# Patient Record
Sex: Male | Born: 1950 | Race: White | Hispanic: No | Smoking: Current every day smoker
Health system: Southern US, Community
[De-identification: ages and names within clinical notes are randomized; demographics above are authoritative.]

## PROBLEM LIST (undated history)

## (undated) DIAGNOSIS — I714 Abdominal aortic aneurysm, without rupture, unspecified: Secondary | ICD-10-CM

## (undated) DIAGNOSIS — I712 Thoracic aortic aneurysm, without rupture: Secondary | ICD-10-CM

## (undated) DIAGNOSIS — G4733 Obstructive sleep apnea (adult) (pediatric): Secondary | ICD-10-CM

## (undated) DIAGNOSIS — H9190 Unspecified hearing loss, unspecified ear: Secondary | ICD-10-CM

## (undated) DIAGNOSIS — I219 Acute myocardial infarction, unspecified: Secondary | ICD-10-CM

## (undated) DIAGNOSIS — I10 Essential (primary) hypertension: Secondary | ICD-10-CM

## (undated) DIAGNOSIS — I7121 Aneurysm of the ascending aorta, without rupture: Secondary | ICD-10-CM

## (undated) DIAGNOSIS — J449 Chronic obstructive pulmonary disease, unspecified: Secondary | ICD-10-CM

## (undated) DIAGNOSIS — M1990 Unspecified osteoarthritis, unspecified site: Secondary | ICD-10-CM

## (undated) DIAGNOSIS — M545 Low back pain, unspecified: Secondary | ICD-10-CM

## (undated) DIAGNOSIS — Z9989 Dependence on other enabling machines and devices: Secondary | ICD-10-CM

## (undated) DIAGNOSIS — I2510 Atherosclerotic heart disease of native coronary artery without angina pectoris: Secondary | ICD-10-CM

## (undated) DIAGNOSIS — G8929 Other chronic pain: Secondary | ICD-10-CM

## (undated) DIAGNOSIS — G709 Myoneural disorder, unspecified: Secondary | ICD-10-CM

## (undated) DIAGNOSIS — E785 Hyperlipidemia, unspecified: Secondary | ICD-10-CM

## (undated) HISTORY — PX: ANTERIOR CERVICAL DECOMP/DISCECTOMY FUSION: SHX1161

## (undated) HISTORY — DX: Essential (primary) hypertension: I10

## (undated) HISTORY — DX: Hyperlipidemia, unspecified: E78.5

## (undated) HISTORY — PX: SPINAL CORD STIMULATOR IMPLANT: SHX2422

## (undated) HISTORY — PX: NASAL SEPTUM SURGERY: SHX37

## (undated) HISTORY — PX: CERVICAL DISC SURGERY: SHX588

## (undated) HISTORY — PX: BACK SURGERY: SHX140

## (undated) HISTORY — PX: POSTERIOR LUMBAR FUSION: SHX6036

## (undated) HISTORY — PX: LAPAROSCOPIC CHOLECYSTECTOMY: SUR755

## (undated) HISTORY — PX: TONSILLECTOMY: SUR1361

## (undated) HISTORY — PX: NASAL SINUS SURGERY: SHX719

## (undated) HISTORY — PX: INGUINAL HERNIA REPAIR: SUR1180

---

## 2001-10-14 ENCOUNTER — Encounter: Payer: Self-pay | Admitting: Emergency Medicine

## 2001-10-14 ENCOUNTER — Emergency Department (HOSPITAL_COMMUNITY): Admission: EM | Admit: 2001-10-14 | Discharge: 2001-10-14 | Payer: Self-pay | Admitting: Emergency Medicine

## 2004-07-15 DIAGNOSIS — I219 Acute myocardial infarction, unspecified: Secondary | ICD-10-CM

## 2004-07-15 HISTORY — PX: LUMBAR DISC SURGERY: SHX700

## 2004-07-15 HISTORY — PX: CARDIAC CATHETERIZATION: SHX172

## 2004-07-15 HISTORY — DX: Acute myocardial infarction, unspecified: I21.9

## 2004-08-17 ENCOUNTER — Encounter: Admission: RE | Admit: 2004-08-17 | Discharge: 2004-08-17 | Payer: Self-pay | Admitting: Specialist

## 2004-11-30 ENCOUNTER — Ambulatory Visit (HOSPITAL_COMMUNITY): Admission: RE | Admit: 2004-11-30 | Discharge: 2004-11-30 | Payer: Self-pay | Admitting: Specialist

## 2004-11-30 ENCOUNTER — Emergency Department (HOSPITAL_COMMUNITY): Admission: EM | Admit: 2004-11-30 | Discharge: 2004-11-30 | Payer: Self-pay | Admitting: Emergency Medicine

## 2004-12-03 ENCOUNTER — Ambulatory Visit (HOSPITAL_COMMUNITY): Admission: RE | Admit: 2004-12-03 | Discharge: 2004-12-04 | Payer: Self-pay | Admitting: Cardiology

## 2005-03-07 ENCOUNTER — Ambulatory Visit (HOSPITAL_COMMUNITY): Admission: RE | Admit: 2005-03-07 | Discharge: 2005-03-08 | Payer: Self-pay | Admitting: Specialist

## 2005-03-07 ENCOUNTER — Encounter (INDEPENDENT_AMBULATORY_CARE_PROVIDER_SITE_OTHER): Payer: Self-pay | Admitting: Specialist

## 2007-07-16 HISTORY — PX: CORONARY ANGIOPLASTY WITH STENT PLACEMENT: SHX49

## 2008-07-15 HISTORY — PX: ANTERIOR LUMBAR FUSION: SHX1170

## 2008-07-15 HISTORY — PX: INCISION AND DRAINAGE POSTERIOR LUMBAR SPINE: SHX1805

## 2009-01-27 ENCOUNTER — Encounter: Admission: RE | Admit: 2009-01-27 | Discharge: 2009-01-27 | Payer: Self-pay | Admitting: Orthopedic Surgery

## 2009-03-16 ENCOUNTER — Inpatient Hospital Stay (HOSPITAL_COMMUNITY): Admission: RE | Admit: 2009-03-16 | Discharge: 2009-03-18 | Payer: Self-pay | Admitting: Orthopedic Surgery

## 2009-03-17 ENCOUNTER — Encounter (INDEPENDENT_AMBULATORY_CARE_PROVIDER_SITE_OTHER): Payer: Self-pay | Admitting: Orthopedic Surgery

## 2009-03-17 ENCOUNTER — Ambulatory Visit: Payer: Self-pay | Admitting: Vascular Surgery

## 2009-03-27 ENCOUNTER — Encounter: Admission: RE | Admit: 2009-03-27 | Discharge: 2009-03-27 | Payer: Self-pay | Admitting: Orthopedic Surgery

## 2009-04-21 ENCOUNTER — Ambulatory Visit: Payer: Self-pay | Admitting: Vascular Surgery

## 2009-08-30 ENCOUNTER — Inpatient Hospital Stay (HOSPITAL_COMMUNITY): Admission: RE | Admit: 2009-08-30 | Discharge: 2009-09-01 | Payer: Self-pay | Admitting: Orthopedic Surgery

## 2009-09-26 ENCOUNTER — Ambulatory Visit (HOSPITAL_COMMUNITY): Admission: RE | Admit: 2009-09-26 | Discharge: 2009-09-26 | Payer: Self-pay | Admitting: Orthopedic Surgery

## 2009-10-09 ENCOUNTER — Inpatient Hospital Stay (HOSPITAL_COMMUNITY): Admission: AD | Admit: 2009-10-09 | Discharge: 2009-10-10 | Payer: Self-pay | Admitting: Orthopedic Surgery

## 2010-05-31 ENCOUNTER — Observation Stay (HOSPITAL_COMMUNITY): Admission: RE | Admit: 2010-05-31 | Discharge: 2010-06-01 | Payer: Self-pay | Admitting: Orthopedic Surgery

## 2010-09-13 IMAGING — RF DG LUMBAR SPINE 2-3V
1 series · 2 of 2 positions shown · non-contrast
Comparison: 03/13/2009 study.

CLINICAL DATA: History given of degenerative disc disease.

LUMBAR SPINE - 2-3 VIEW

[Series 1: run · 2 of 2 slices shown]
[im 1/2]
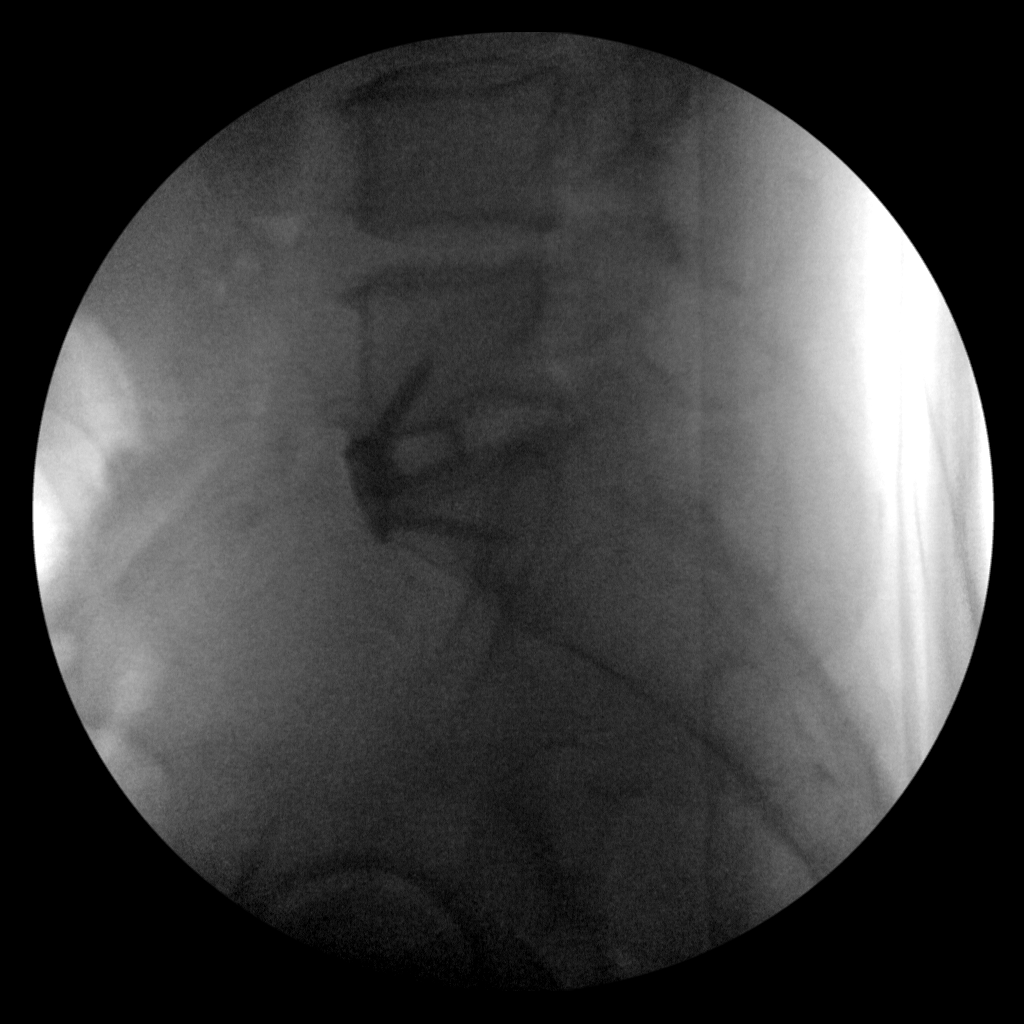
[im 2/2]
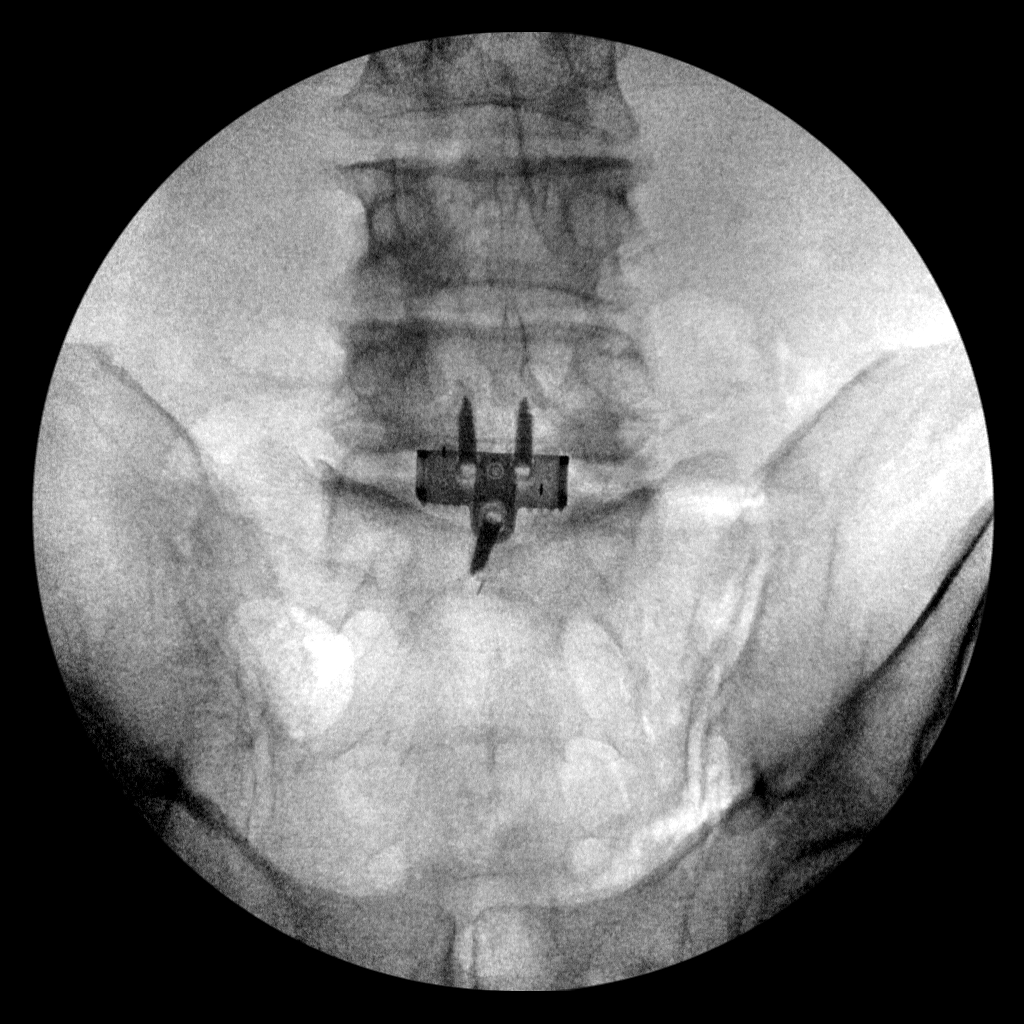

[2 of 2 positions shown; findings below may reference images not displayed]

FINDINGS: Cross-table lateral fluoroscopic images submitted.  The
metallic hardware is seen at the L5-S1 level.
IMPRESSION: Metallic hardware is present at the L5-S1 level.

## 2010-09-25 LAB — BASIC METABOLIC PANEL
BUN: 18 mg/dL (ref 6–23)
CO2: 29 mEq/L (ref 19–32)
Calcium: 9.6 mg/dL (ref 8.4–10.5)
Glucose, Bld: 105 mg/dL — ABNORMAL HIGH (ref 70–99)
Potassium: 4 mEq/L (ref 3.5–5.1)
Sodium: 136 mEq/L (ref 135–145)

## 2010-09-25 LAB — CBC
HCT: 45.9 % (ref 39.0–52.0)
Hemoglobin: 15.2 g/dL (ref 13.0–17.0)
MCH: 30.2 pg (ref 26.0–34.0)
MCHC: 33.1 g/dL (ref 30.0–36.0)
RDW: 13.5 % (ref 11.5–15.5)

## 2010-10-03 LAB — CBC
HCT: 40.8 % (ref 39.0–52.0)
MCHC: 34.1 g/dL (ref 30.0–36.0)
MCV: 88.1 fL (ref 78.0–100.0)
Platelets: 238 10*3/uL (ref 150–400)
RDW: 13.2 % (ref 11.5–15.5)

## 2010-10-03 LAB — BASIC METABOLIC PANEL
BUN: 19 mg/dL (ref 6–23)
CO2: 30 mEq/L (ref 19–32)
Chloride: 102 mEq/L (ref 96–112)
Glucose, Bld: 90 mg/dL (ref 70–99)
Potassium: 4.5 mEq/L (ref 3.5–5.1)

## 2010-10-07 LAB — CBC
MCHC: 33.6 g/dL (ref 30.0–36.0)
Platelets: 190 10*3/uL (ref 150–400)
RDW: 13.5 % (ref 11.5–15.5)

## 2010-10-07 LAB — ANAEROBIC CULTURE

## 2010-10-07 LAB — GRAM STAIN

## 2010-10-07 LAB — WOUND CULTURE

## 2010-10-07 LAB — BASIC METABOLIC PANEL
BUN: 21 mg/dL (ref 6–23)
CO2: 30 mEq/L (ref 19–32)
CO2: 32 mEq/L (ref 19–32)
Calcium: 8.4 mg/dL (ref 8.4–10.5)
Chloride: 103 mEq/L (ref 96–112)
Creatinine, Ser: 0.7 mg/dL (ref 0.4–1.5)
Creatinine, Ser: 0.83 mg/dL (ref 0.4–1.5)
GFR calc Af Amer: >60 mL/min (ref >60)
GFR calc Af Amer: >60 mL/min (ref >60)
Glucose, Bld: 101 mg/dL — ABNORMAL HIGH (ref 70–99)
Glucose, Bld: 104 mg/dL — ABNORMAL HIGH (ref 70–99)

## 2010-10-20 LAB — CBC
Hemoglobin: 16.8 g/dL (ref 13.0–17.0)
RBC: 5.61 MIL/uL (ref 4.22–5.81)
RDW: 15.3 % (ref 11.5–15.5)

## 2010-10-20 LAB — BASIC METABOLIC PANEL
Calcium: 9.7 mg/dL (ref 8.4–10.5)
GFR calc Af Amer: >60 mL/min (ref >60)
GFR calc non Af Amer: >60 mL/min (ref >60)
Sodium: 141 mEq/L (ref 135–145)

## 2010-10-20 LAB — TYPE AND SCREEN: Antibody Screen: NEGATIVE

## 2010-11-27 NOTE — Op Note (Signed)
NAME:  Nathan Pham, Nathan Pham NO.:  [id number]   MEDICAL RECORD NO.:  [id number]          PATIENT TYPE:  INP   LOCATION:  5023                         FACILITY:  [facility]   PHYSICIAN:  Alvy Beal, MD    DATE OF BIRTH:  Jun 17, 1951   DATE OF PROCEDURE:  03/16/2009  DATE OF DISCHARGE:                               OPERATIVE REPORT   HISTORY:  This is a very pleasant 60 year old gentleman, who presented  to my office with complaints of significant back pain.  Attempts at  conservative management including injection therapy and activity  modification failed to alleviate his symptoms, and so surgery was  elected.  I discussed all risks, benefits, and alternatives to the case  with him, and consent was obtained.   PREOPERATIVE DIAGNOSIS:  Degenerative disk disease, L5-S1 with  diskogenic back pain.   POSTOPERATIVE DIAGNOSIS:  Degenerative disk disease, L5-S1 with  diskogenic back pain.   OPERATIVE PROCEDURE:  Anterior lumbar interbody fusion, L5-S1.   INSTRUMENTATION USED:  Stryker PEEK interbody cage 14-mm, 8-degree  lordosis packed with Actifuse with a 14-mm high RSD anterior lumbar  plate with appropriate screw fixation.   COMPLICATIONS:  None.   APPROACH SURGEONS:  1. Larina Earthly, MD  2. Jorge Ny, MD   OPERATIVE NOTE:  The patient was brought to the operating room and  placed supine on the operating table.  After successful induction of  general anesthesia and endotracheal intubation, TED and SCDs were  applied, and a Foley was inserted.  The patient was placed supine on the  operative table, and Dr. Arbie Cookey and Dr. Anner Crete were then scrubbed in for a  standard retroperitoneal approach to the lumbar spine.  Once this was  completed, I scrubbed back into the case.  I confirmed the appropriate  level with lateral fluoroscopy.   Once I confirmed the appropriate level, I incised the annulus with a #10  scalpel and then using a combination of pituitary  rongeurs, curettes,  and Kerrison rongeurs, I resected the entire L5-S1 disk material.  I  then stripped the annulus from the posterior aspect of the S1 and L5  vertebral bodies and then used a 2-mm Kerrison to resect the overlying  osteophytes.  At this point, I then rasped the endplates, so I had  excellent bleeding bone.  I copiously irrigated with normal saline and  then trialed implants.  I noted that the 14, 8-degrees lordotic insert  provided the maximal amount of indirect foraminal decompression and was  the most stable.  As such, I elected to use this implant.  I took the  small footprint 14 x 8, packed it with Actifuse, and using the  atraumatic SQUID inserter inserted to the appropriate depth.  I then  bone tamp it back slightly posteriorly.  I then packed the remaining  amount of Actifuse anterior to the cage and then secured the RSD plate  over that.  Then, using an awl, I broached the cortex of L5 and then  passed 2 screws 20 mm in length and 6.5 diameter through the plate and  into  the vertebral body.  I then placed a 30-mm screw into the body of  S1.  I had excellent fixation and I got good compression.  I then put  the locking cap on to prevent screw back out, and took final AP and  lateral x-rays.  Satisfied with the fixation, I sequentially removed the  Thompson blades, ensuring that there was no adverse bleeding.  I then  closed the rectus fascia with a running #1 Vicryl suture, superficial  with 2-0 and 3-0 Monocryl for skin.  Steri-Strips and dry dressing  were applied.  Intraoperative digital x-ray was taken to confirm that  just the implant and plate were left, and there were no other sterile  equipment left in the wound.  In addition, all counts were correct.  The  patient was extubated and transferred to PACU without incident.      Alvy Beal, MD  Electronically Signed     DDB/MEDQ  D:  03/16/2009  T:  03/17/2009  Job:  [id number]   cc:   Larina Earthly,  M.D.  Jorge Ny, MD

## 2010-11-27 NOTE — Op Note (Signed)
NAME:  Nathan Pham, Nathan Pham             ACCOUNT NO.:  [id number]   MEDICAL RECORD NO.:  [id number]          PATIENT TYPE:  INP   LOCATION:  5023                         FACILITY:  [facility]   PHYSICIAN:  Juleen China IV, MDDATE OF BIRTH:  08-09-1950   DATE OF PROCEDURE:  03/16/2009  DATE OF DISCHARGE:                               OPERATIVE REPORT   PREOPERATIVE DIAGNOSIS:  Degenerative L5-S1 disease.   POSTOPERATIVE DIAGNOSIS:  Degenerative L5-S1 disease.   PROCEDURE PERFORMED:  Anterior exposure of the L5-S1 joint space.   SURGEON:  1. Charlena Cross, MD   ASSISTANT:  Larina Earthly, MD   BLOOD LOSS:  Minimal.   INDICATIONS:  This is a 60 year old gentleman who comes in for back  surgery by Dr. Shon Baton.  We have been asked to provide anterior exposure.  The risks and benefits were discussed with the patient in the holding  area.   PROCEDURE:  The patient was identified in the holding and taken to room  15.  He was placed supine on the table.  General anesthesia was  administered.  The patient was prepped and draped in standard sterile  fashion.  A time-out was called.  Antibiotics were given.  The L5-S1  joint space was identified with fluoroscopy.  A transverse incision was  made in the lower abdomen from the midline to the lateral edge of the  rectus sheath.  Cautery was used to divide the subcutaneous tissue.  The  anterior abdominal fascia was then isolated and then opened with Bovie  cautery.  The rectus muscle was mobilized along its medial border.  Once  it had been fully mobilized, it was reflected laterally.  I tried to  enter the retroperitoneal space from this approach, however, ended up  mobilizing the lateral edge of the rectus muscle in order to gain access  into the retroperitoneal space.  Once the retroperitoneal plane had been  developed, the peritoneum was swept medially.  The posterior peritoneum  was then opened sharply.  This provided further mobilization  of the  peritoneum medially.  The psoas muscle was identified.  The tissue along  the common and external iliac artery was skeletonized, isolating the  iliac artery as well as the vein.  The joint space was palpated within  the bifurcation of the abdominal aorta.  Median sacral vessels were  divided using bipolar cautery and metal clips.  The vein was further  mobilized until left lateral side of the bone was encountered.  Similarly, the medial side of the spine was mobilized bluntly.  Once  adequate exposure was obtained, a Thompson  retractor was placed.  Reverse lip 150 blades were placed on the lateral  edge of the spine.  Malleable retractors were then placed for  craniocaudad exposure.  At this point in time, Dr. Shon Baton came in to  perform his procedure.  Please see his detailed operative note for  details of his aspect of the procedure.      Jorge Ny, MD  Electronically Signed     VWB/MEDQ  D:  03/16/2009  T:  03/17/2009  Job:  [id number]

## 2010-11-27 NOTE — Assessment & Plan Note (Signed)
OFFICE VISIT   Nathan Pham, Nathan Pham  DOB:  [birth year]                                       04/21/2009  EAVWU#:[id number]   The patient presents today for evaluation of right foot and leg pain.  He is known to me from an anterior exposure for level L5-S1 ALIF.  Surgery was on September 2.  He reports unusual constellation of pain in  his right foot.  He reports a severe burning discomfort from  approximately his third toe to the medial aspect of his foot extending  up onto his leg.  He did have some swelling initially, but there is no  swelling currently.  He had two negative ultrasounds for ruling out deep  venous thrombosis.  On physical exam his abdominal wound in left lower  quadrant is completely healed with no tenderness.  He does have normal  pedal pulses bilaterally.  His feet are well-perfused bilaterally.  He  has palpable pedal pulses bilaterally.  The patient has one last visit  with Dr. Shon Baton and had plans for potential injection for pain relief.  He reports that this has been denied by his insurance carrier.  I  explained that I do not see any evidence of arterial venous pathology to  explain his unusual complex of right foot burning and numbness.  He will  continue to follow with Dr. Shon Baton and see [place] again on an as-needed  basis.   Larina Earthly, M.D.  Electronically Signed   TFE/MEDQ  D:  04/21/2009  T:  04/24/2009  Job:  [id number]   cc:   Nathan Beal, MD

## 2010-11-30 NOTE — Cardiovascular Report (Signed)
NAME:  Nathan Pham NO.:  [id number]   MEDICAL RECORD NO.:  [id number]          PATIENT TYPE:  OIB   LOCATION:  2003                         FACILITY:  [facility]   PHYSICIAN:  Nathan Pham, M.D.DATE OF BIRTH:  10-01-1950   DATE OF PROCEDURE:  DATE OF DISCHARGE:                              CARDIAC CATHETERIZATION   PROCEDURE:  Retrograde central aortic catheterization, selective coronary  angiography by Judkins technique, pre- and post-IC nitroglycerin  administration, LV angiogram RAO-LAO projection, sub-selective LIMA-RIMA,  nominal aortic angiogram mid-stream PA projection.   PROCEDURE:  The patient was brought to the second floor CP Lab in the post-  absorptive state.  After premedication with 5 mg __Valium________.  The  right groin was prepped and draped in the usual manner The CRFA__________  arterial puncture using an 18 thin wall needle.  A 6-French short Daig  sidearm sheaths were inserted without difficulty.  Diagnostic coronary  angiography was done with a 6-French 4-cm taper on preformed Scimed coronary  catheters pre- and post-IC nitroglycerin administration of 200 mcg into the  left coronary artery.  Sub-selective LIMA and RIMA were done by hand  injection with the right coronary catheter.  LV angiogram was done in the  RAO and LAO projection with 25 mL at 14 mL per second and 20 mL at 12 mL per  second through a 6-French pigtail catheter.  Pullback pressure of CA was  performed and showed no gradient across the aortic valve.  Abdominal aortic  angiogram was done in the mid-stream PA projection above the left renal  arteries at 25 mL, 20 mL per second.  The catheter was removed and the side-  arm sheath was flushed.  The patient was given 300 mg Plavix p.o. and one on  this preoperatively with catheterization demonstrating coronary disease as  will be outlined below.  He tolerated the procedure well and was transferred  to the holding area  for postoperative care.  Sheath removal and pressure  hemostasis of the right groin.   PRESSURES:  LV:  150/0; LVEDP 18 mmHg.  CA:  150/80 mmHg.   There is no gradient across the aortic valve on catheter pullback.   LV angiogram in the RAO and LAO projection showed a mid anterolateral wall  motion abnormality and a mid septal abnormality.  EF was well-preserved at  approximately 55%.  There was no mitral regurgitation.  No other segmental  wall motion abnormalities.   The LIMA and RIMA were widely patent with no subclavian or brachiocephalic  stenosis.  The right vertebral was antegrade.  The left vertebral had 70-80%  ostial concentric narrowing with good flow.   Fluoroscopy revealed +1 LAD calcification.  There was no intracardiac or  valvular calcification seen.   The main left coronary was large with no significant stenosis.   The left anterior descending had a 70% smooth narrowing after the first  diagonal branch and after the first septal perforator.  There was 40% mid  LAD narrowing.  There were irregularities, but no other significant  stenosis, and the LAD coursed to the apex of the heart  but ended there.   The first diagonal was a relatively long, thin, bifurcating vessel.  There  was a greater than 95% segmental stenosis justification branch was thin and  had no significant disease and the inferior bifurcation branch had tandem  90% lesions and was quite thin.  There was antegrade flow.   There was a large OM-1 (distribution of an optional diagonal) that had mild  irregularities with no significant stenosis.   The circumflex artery had approximately 70% eccentric smooth narrowing in  its proximal third before the OM-1.  The OM-1 had 40-50% proximal narrowing.  The mid circumflex had 40-50% narrowing and then gave off an atrial and a  posterior smooth narrowing in the mid portion beyond the RV branch.  There  was 70-75% concentric segmental disease of the proximal PLA  that bifurcated.  The PDA was large and had no significant disease.   DISCUSSION:  This 60 year old, white, separated, father of three with five  grandchildren, because of chronic back pain.  He was initially set up for  possible disk surgery by Dr. Shelle Pham on Nov 30, 2004,  however, preoperative  EKG was abnormal and changed from prior EKGs and he was seen by Dr. Clarene Pham  in consultation.  The patient did not have any history of chest pain, severe  indigestion, or significant outpatient episodes that sounded ischemic.  He  did have poor R-wave progression V1 through V3, ST elevation V1 through V3,  with anterolateral T-wave inversion across the precordium in 1 in aVL.  This  was changed from prior EKG of January 08, 2003.  The patient has a history of  one to two pack cigarette abuse recently cut down to a half a pack a day,  systemic hypertension, hyperlipidemia.  He was set up for elective  catheterization in view of the consideration for upcoming back surgery.   The patient has high-grade stenosis of a very small, thin, diffusely  diseased DX-1 as outlined above with an anterolateral wall motion  abnormality that appears to be his culprit lesion.  On further questioning,  the patient still denies any cardiac ischemic symptoms or equivalent  symptoms.  He has non-critical three-vessel disease as outlined above and  has not been on medical therapy before for CAD.   At this point in time, I would recommend putting off (19 months).  I would  institute medical therapy for his coronary disease including beta blocker,  aspirin, statin, and probable ACE inhibitor with associated hypertension and  normal renal function.  I would also add Plavix at present, which can be  interrupted in the future if necessary.  I would recommend medical followup  and then possible consideration for back surgery and being followed up  probably for at least four to six weeks.  Cardiolite testing may be helpful for  further evaluation and risk stratification as well in view of the  patient's anatomy as outlined above.   CATHETERIZATION DIAGNOSES:  1. ASHD - recent silent anterolateral MI related to subtotal __________.  2. Three-vessel coronary disease, moderately severe, noncritical.  3. Well-preserved EF, approximately 55%.  4. Hyperlipidemia.  5. Cigarette abuse, chronic bronchitis.  6. Systemic hypertension, normal renal arteries.  7. Chronic back pain with L4 disk disease.        RAW/MEDQ  D:  12/03/2004  T:  12/03/2004  Job:  [id number]   cc:   Thereasa Solo. Little, M.D.  [address]  [address]  [place]  [place] [address]  Fax: [phone]   Jene Every, M.D.  [address]  [place]  [place] [address]  Fax: [phone]

## 2010-11-30 NOTE — Discharge Summary (Signed)
NAME:  Nathan Pham, Nathan Pham NO.:  [id number]   MEDICAL RECORD NO.:  [id number]          PATIENT TYPE:  OIB   LOCATION:  2003                         FACILITY:  [facility]   PHYSICIAN:  Thereasa Solo. Little, M.D. DATE OF BIRTH:  1951-07-09   DATE OF ADMISSION:  12/03/2004  DATE OF DISCHARGE:  12/04/2004                                 DISCHARGE SUMMARY   DISCHARGE DIAGNOSES:  1.  Abnormal preoperative electrocardiogram, subsequent catheterization      revealing three vessel coronary disease of moderate degree.  2.  Dyslipidemia.  3.  Smoking history.  4.  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   HOSPITAL COURSE:  The patient is a 60 year old male with history of high  cholesterol and smoking but no prior coronary disease. He had a prior stress  test at [facility] in 2004 prior to gallbladder surgery which was normal.  He  has not had any chest pain or any unusual dyspnea.  He came in for back  surgery on Nov 30, 2004.  His electrocardiogram showed anterior ST changes.  He was asymptomatic.  He has denied any recent episodes of epigastric  discomfort or shortness of breath.  It was decided to proceed with  diagnostic catheterization.  This was set up for Dec 03, 2004 and the  patient was admitted that day.  Catheterization done by Dr. Alanda Amass  revealed high grade proximal diagonal disease, 60 to 70% smooth left  anterior descending narrowing, 70% smooth proximal circumflex narrowing and  a 70 to 75% distal right coronary artery narrowing.  His ejection fraction  was 55%.  He did have some anterior lateral wall motion abnormality.  The  patient tolerated the procedure well.  It was decided to treat him with  aggressive medical therapy for about six weeks and then he can have back  surgery.  He is discharged Dec 04, 2004 and will follow up with Dr. Clarene Duke  on December 21, 2004.   DISCHARGE MEDICATIONS:  1.  Coated aspirin once daily.  2.  Plavix 75 mg a day.  3.  Lipitor 80  mg a day.  4.  Enalapril 2.5 mg a day.  5.  Metoprolol 25 mg twice a day.  6.  Wellbutrin SR 150 mg b.i.d.  7.  Nitroglycerin sublingual PRN.   LABORATORY DATA:  White blood cell count 9.6, hemoglobin 15.6, hematocrit  45.8, platelet count 247,000, INR 0.8, sodium 140, potassium 4.4, BUN 6,  creatinine 0.9.  CK and troponin's are negative X2.  Chest x-ray showed no  acute abnormalities.  His electrocardiogram shows sinus rhythm with anterior  T wave inversion.   DISPOSITION:  Patient is discharged in stable condition and will follow up  with Dr. Clarene Duke.  Plan is for medical therapy for about six weeks and then  he will be cleared for surgery.  Dr. Alanda Amass had suggested he might need a  Cardiolite study to assess any other areas of ischemia but Dr. Clarene Duke will  review this when he sees him in the office.      LKK/MEDQ  D:  12/04/2004  T:  12/04/2004  Job:  [id number]   cc:   Jene Every, M.D.  [address]  [place]  [place] [address]  Fax: [phone]

## 2010-11-30 NOTE — Op Note (Signed)
NAME:  Nathan Pham, Nathan Pham NO.:  [id number]   MEDICAL RECORD NO.:  [id number]          PATIENT TYPE:  AMB   LOCATION:  DAY                          FACILITY:  [facility]   PHYSICIAN:  Jene Every, M.D.    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   POSTOPERATIVE DIAGNOSES:  Spinal stenosis, herniated nucleus pulposus at L5-  S1 right.   PROCEDURE:  Hemilaminotomy and lateral recess decompression L4-5 right,  foraminotomy L4 and L5, microdiskectomy L4-5 right with the use of the  operating microscope.   ANESTHESIA:  General.   ASSISTANT:  Roma Schanz, P.A.   BRIEF HISTORY:  This is a 60 year old with refractory right lower extremity  radicular pain in the L5 nerve root distribution with secondary facet  ligamentum flavum hypertrophy stenosis and HNP. The patient had persistent  symptoms, positive neural tension signs, EHL weakness, was rescheduled due  to cardiac difficulties. He was seen by cardiologist, cleared __________  risks though had disabling symptoms that kept him from returning to work and  his activities of daily living but felt it was not able to be treated  medically. Therefore proceeded with surgical decompression. The risks and  benefits were discussed including bleeding, infection, injury to  neurovascular structures, CSF leakage, epidural fibrosis, adjacent segment  disease, need for fusion in the future, anesthetic complications, DVT, PE,  death, etc.   TECHNIQUE:  The patient in the supine position and after the induction of  adequate general anesthesia and 1 gram of Kefzol, he was placed prone on the  [place] frame, all bony prominences well padded. The lumbar region was  prepped and draped in the usual sterile fashion. Two 18 gauge spinal needles  were utilized  to localize the L4-5 interspace confirmed with x-ray. An  incision was made from the spinous process of 4 to 5, subcutaneous tissue  was dissected, electrocautery was utilized to achieve hemostasis. The  dorsolumbar fascia was identified and divided in line with the skin  incision, paraspinous muscle elevated from the lamina of 4 and 5. McCullough  retractor is placed, operating microscope is draped and brought onto the  surgical field. A second confirmatory radiograph obtained with a Penfield 4  in the interlaminar space at 4-5. The high speed bur was utilized to remove  the inferior caudad edge of the lamina of 4, this was then extended with a 2  and a 3 mm Kerrison detaching the cephalad edge of the ligamentum flavum  attachments. This was also performed with a 2 mm Kerrison detaching the  cephalad edge at 5. Foraminotomy was performed, hypertrophic ligamentum and  facet was noted to be pressing significantly stenosing the lateral recess.  After foraminotomy of 5 was performed, the ligamentum flavum removed from  the interspace with a patty placed beneath the ligament and protecting the  neural elements at all times. After completing the removal of the ligament,  decompressed the lateral recess  in the medial border of the pedicle.  Stenosis into the foramen of 4 as well. We performed a foraminotomy of 4 as  well. Protecting the neural elements at all times was good fatty tissue  around the 4 root. I gently mobilized the thecal sac medially, a focal HNP  was noted. Bipolar electrocautery had been utilized to achieve strict  hemostasis. After performing annulotomy, a copious portion of the disk  material was removed from the subannular space that was herniated. Medially  and out to the medial border of the pedicle, a small fragment was coaxed  from the foramen in the subannular space with a nerve hook and retrieved  with a pituitary. It was mobilized with a hockey stick and an Epstein.  Full  diskectomy of herniated material was performed. Following this, good  excursion of the 5 root at least a centimeter __________ pedicle without  difficulty, hockey stick probe placed and the foramen of 4 found to be  widely patent. Copiously irrigated the disk space with antibiotic  irrigation. Bone wax was placed on the cancellous surface. Again no evidence  of CSF leakage or active bleeding. Excellent decompression in the lateral  recess of the 4 and 5 roots have been performed. Next, again the wound was  copiously irrigated, placed thrombin soaked Gelfoam in the laminotomy  defect. We removed the McCullough retractor and paraspinal muscles inspected  with no evidence of active bleeding. The dorsolumbar fascia reapproximated  with #1 Vicryl interrupted figure-of-eight sutures, subcutaneous tissues  reapproximated with 2-0 Vicryl simple suture. The skin was reapproximated  with 4-0 subcuticular Prolene. The wound was reinforced with Steri-Strips.  Sterile dressings applied. Placed supine on the hospital bed, extubated  without difficulty and transported to the recovery room in satisfactory  condition.   The patient tolerated the procedure well with no complications, minimal  blood loss. Assistant Roma Schanz.      Jene Every, M.D.  Electronically Signed     JB/MEDQ  D:  03/07/2005  T:  03/07/2005  Job:  [id number]

## 2011-09-02 DIAGNOSIS — Z79899 Other long term (current) drug therapy: Secondary | ICD-10-CM | POA: Diagnosis not present

## 2011-09-02 DIAGNOSIS — I10 Essential (primary) hypertension: Secondary | ICD-10-CM | POA: Diagnosis not present

## 2011-09-02 DIAGNOSIS — E782 Mixed hyperlipidemia: Secondary | ICD-10-CM | POA: Diagnosis not present

## 2011-09-02 DIAGNOSIS — Z125 Encounter for screening for malignant neoplasm of prostate: Secondary | ICD-10-CM | POA: Diagnosis not present

## 2011-09-02 DIAGNOSIS — I259 Chronic ischemic heart disease, unspecified: Secondary | ICD-10-CM | POA: Diagnosis not present

## 2011-09-18 DIAGNOSIS — J449 Chronic obstructive pulmonary disease, unspecified: Secondary | ICD-10-CM | POA: Diagnosis not present

## 2011-09-18 DIAGNOSIS — F17200 Nicotine dependence, unspecified, uncomplicated: Secondary | ICD-10-CM | POA: Diagnosis not present

## 2011-09-18 DIAGNOSIS — I2510 Atherosclerotic heart disease of native coronary artery without angina pectoris: Secondary | ICD-10-CM | POA: Diagnosis not present

## 2011-09-18 DIAGNOSIS — I10 Essential (primary) hypertension: Secondary | ICD-10-CM | POA: Diagnosis not present

## 2011-09-18 DIAGNOSIS — E785 Hyperlipidemia, unspecified: Secondary | ICD-10-CM | POA: Diagnosis not present

## 2011-09-24 DIAGNOSIS — I2510 Atherosclerotic heart disease of native coronary artery without angina pectoris: Secondary | ICD-10-CM | POA: Diagnosis not present

## 2011-10-18 DIAGNOSIS — R238 Other skin changes: Secondary | ICD-10-CM | POA: Diagnosis not present

## 2012-02-03 DIAGNOSIS — H5210 Myopia, unspecified eye: Secondary | ICD-10-CM | POA: Diagnosis not present

## 2012-02-03 DIAGNOSIS — H52229 Regular astigmatism, unspecified eye: Secondary | ICD-10-CM | POA: Diagnosis not present

## 2012-02-03 DIAGNOSIS — H524 Presbyopia: Secondary | ICD-10-CM | POA: Diagnosis not present

## 2012-02-03 DIAGNOSIS — H43399 Other vitreous opacities, unspecified eye: Secondary | ICD-10-CM | POA: Diagnosis not present

## 2012-03-24 DIAGNOSIS — I10 Essential (primary) hypertension: Secondary | ICD-10-CM | POA: Diagnosis not present

## 2012-03-24 DIAGNOSIS — M79609 Pain in unspecified limb: Secondary | ICD-10-CM | POA: Diagnosis not present

## 2012-03-24 DIAGNOSIS — E7800 Pure hypercholesterolemia, unspecified: Secondary | ICD-10-CM | POA: Diagnosis not present

## 2012-06-05 DIAGNOSIS — I10 Essential (primary) hypertension: Secondary | ICD-10-CM | POA: Diagnosis not present

## 2012-06-05 DIAGNOSIS — E7800 Pure hypercholesterolemia, unspecified: Secondary | ICD-10-CM | POA: Diagnosis not present

## 2012-06-25 DIAGNOSIS — I10 Essential (primary) hypertension: Secondary | ICD-10-CM | POA: Diagnosis not present

## 2012-06-25 DIAGNOSIS — E782 Mixed hyperlipidemia: Secondary | ICD-10-CM | POA: Diagnosis not present

## 2012-07-31 DIAGNOSIS — Z23 Encounter for immunization: Secondary | ICD-10-CM | POA: Diagnosis not present

## 2012-08-14 DIAGNOSIS — J209 Acute bronchitis, unspecified: Secondary | ICD-10-CM | POA: Diagnosis not present

## 2012-08-14 DIAGNOSIS — J449 Chronic obstructive pulmonary disease, unspecified: Secondary | ICD-10-CM | POA: Diagnosis not present

## 2012-12-22 DIAGNOSIS — Z79899 Other long term (current) drug therapy: Secondary | ICD-10-CM | POA: Diagnosis not present

## 2012-12-22 DIAGNOSIS — E7800 Pure hypercholesterolemia, unspecified: Secondary | ICD-10-CM | POA: Diagnosis not present

## 2013-01-19 DIAGNOSIS — R5381 Other malaise: Secondary | ICD-10-CM | POA: Diagnosis not present

## 2013-01-19 DIAGNOSIS — Z125 Encounter for screening for malignant neoplasm of prostate: Secondary | ICD-10-CM | POA: Diagnosis not present

## 2013-01-19 DIAGNOSIS — F17200 Nicotine dependence, unspecified, uncomplicated: Secondary | ICD-10-CM | POA: Diagnosis not present

## 2013-01-19 DIAGNOSIS — R5383 Other fatigue: Secondary | ICD-10-CM | POA: Diagnosis not present

## 2013-01-19 DIAGNOSIS — Z79899 Other long term (current) drug therapy: Secondary | ICD-10-CM | POA: Diagnosis not present

## 2013-01-19 DIAGNOSIS — D539 Nutritional anemia, unspecified: Secondary | ICD-10-CM | POA: Diagnosis not present

## 2013-02-03 DIAGNOSIS — Z79899 Other long term (current) drug therapy: Secondary | ICD-10-CM | POA: Diagnosis not present

## 2013-03-10 ENCOUNTER — Other Ambulatory Visit: Payer: Self-pay | Admitting: Orthopedic Surgery

## 2013-03-10 DIAGNOSIS — Z9889 Other specified postprocedural states: Secondary | ICD-10-CM

## 2013-03-17 ENCOUNTER — Ambulatory Visit
Admission: RE | Admit: 2013-03-17 | Discharge: 2013-03-17 | Disposition: A | Discharge status: Home / Self Care | Payer: Medicare Other | Source: Ambulatory Visit | Attending: Orthopedic Surgery | Admitting: Orthopedic Surgery

## 2013-03-17 VITALS — BP 97/64 | HR 71

## 2013-03-17 DIAGNOSIS — Z9889 Other specified postprocedural states: Secondary | ICD-10-CM

## 2013-03-17 MED ORDER — DIAZEPAM 5 MG PO TABS
10.0000 mg | ORAL_TABLET | Freq: Once | ORAL | Status: AC
  Administered 2013-03-17: 10 mg via ORAL

## 2013-03-17 MED ORDER — IOHEXOL 180 MG/ML  SOLN
20.0000 mL | Freq: Once | INTRAMUSCULAR | Status: AC | PRN
  Administered 2013-03-17: 20 mL via INTRATHECAL

## 2013-06-21 DIAGNOSIS — E7800 Pure hypercholesterolemia, unspecified: Secondary | ICD-10-CM | POA: Diagnosis not present

## 2013-06-21 DIAGNOSIS — R0989 Other specified symptoms and signs involving the circulatory and respiratory systems: Secondary | ICD-10-CM | POA: Diagnosis not present

## 2013-06-21 DIAGNOSIS — F17200 Nicotine dependence, unspecified, uncomplicated: Secondary | ICD-10-CM | POA: Diagnosis not present

## 2013-06-21 DIAGNOSIS — I10 Essential (primary) hypertension: Secondary | ICD-10-CM | POA: Diagnosis not present

## 2013-08-06 DIAGNOSIS — H6190 Disorder of external ear, unspecified, unspecified ear: Secondary | ICD-10-CM | POA: Diagnosis not present

## 2013-08-16 DIAGNOSIS — L989 Disorder of the skin and subcutaneous tissue, unspecified: Secondary | ICD-10-CM | POA: Diagnosis not present

## 2013-08-16 DIAGNOSIS — L851 Acquired keratosis [keratoderma] palmaris et plantaris: Secondary | ICD-10-CM | POA: Diagnosis not present

## 2013-08-16 DIAGNOSIS — L83 Acanthosis nigricans: Secondary | ICD-10-CM | POA: Diagnosis not present

## 2013-08-16 DIAGNOSIS — L578 Other skin changes due to chronic exposure to nonionizing radiation: Secondary | ICD-10-CM | POA: Diagnosis not present

## 2013-08-17 DIAGNOSIS — R0989 Other specified symptoms and signs involving the circulatory and respiratory systems: Secondary | ICD-10-CM | POA: Diagnosis not present

## 2013-08-18 DIAGNOSIS — I259 Chronic ischemic heart disease, unspecified: Secondary | ICD-10-CM | POA: Diagnosis not present

## 2013-08-18 DIAGNOSIS — R0989 Other specified symptoms and signs involving the circulatory and respiratory systems: Secondary | ICD-10-CM | POA: Diagnosis not present

## 2013-08-18 DIAGNOSIS — I10 Essential (primary) hypertension: Secondary | ICD-10-CM | POA: Diagnosis not present

## 2013-08-25 DIAGNOSIS — Z23 Encounter for immunization: Secondary | ICD-10-CM | POA: Diagnosis not present

## 2013-08-27 DIAGNOSIS — Z01818 Encounter for other preprocedural examination: Secondary | ICD-10-CM | POA: Diagnosis not present

## 2013-08-27 LAB — PULMONARY FUNCTION TEST

## 2013-09-07 ENCOUNTER — Telehealth: Payer: Self-pay | Admitting: Critical Care Medicine

## 2013-09-08 NOTE — Telephone Encounter (Signed)
Patient is scheduled 3/31 @ 4:00.  Received records.  Dr. Janace Aris office informed of appt time and date.  [facility]

## 2013-09-08 NOTE — Telephone Encounter (Signed)
Threasa Beards, will you please help with scheduling this?  Thank you.

## 2013-09-15 DIAGNOSIS — M47817 Spondylosis without myelopathy or radiculopathy, lumbosacral region: Secondary | ICD-10-CM | POA: Diagnosis not present

## 2013-09-15 DIAGNOSIS — Z01810 Encounter for preprocedural cardiovascular examination: Secondary | ICD-10-CM | POA: Diagnosis not present

## 2013-09-15 DIAGNOSIS — F17200 Nicotine dependence, unspecified, uncomplicated: Secondary | ICD-10-CM | POA: Diagnosis not present

## 2013-09-15 DIAGNOSIS — J449 Chronic obstructive pulmonary disease, unspecified: Secondary | ICD-10-CM | POA: Diagnosis not present

## 2013-09-15 DIAGNOSIS — E785 Hyperlipidemia, unspecified: Secondary | ICD-10-CM | POA: Diagnosis not present

## 2013-09-15 DIAGNOSIS — I10 Essential (primary) hypertension: Secondary | ICD-10-CM | POA: Diagnosis not present

## 2013-09-15 DIAGNOSIS — I7090 Unspecified atherosclerosis: Secondary | ICD-10-CM | POA: Diagnosis not present

## 2013-09-15 DIAGNOSIS — I2510 Atherosclerotic heart disease of native coronary artery without angina pectoris: Secondary | ICD-10-CM | POA: Diagnosis not present

## 2013-09-21 ENCOUNTER — Encounter: Payer: Self-pay | Admitting: Internal Medicine

## 2013-09-21 ENCOUNTER — Ambulatory Visit (INDEPENDENT_AMBULATORY_CARE_PROVIDER_SITE_OTHER)
Admission: RE | Admit: 2013-09-21 | Discharge: 2013-09-21 | Disposition: A | Discharge status: Home / Self Care | Payer: Medicare Other | Source: Ambulatory Visit | Attending: Internal Medicine | Admitting: Internal Medicine

## 2013-09-21 ENCOUNTER — Ambulatory Visit (INDEPENDENT_AMBULATORY_CARE_PROVIDER_SITE_OTHER): Payer: Medicare Other | Admitting: Internal Medicine

## 2013-09-21 VITALS — BP 110/78 | HR 70 | Temp 98.0°F | Ht 70.5 in | Wt 196.8 lb

## 2013-09-21 DIAGNOSIS — J449 Chronic obstructive pulmonary disease, unspecified: Secondary | ICD-10-CM | POA: Insufficient documentation

## 2013-09-21 DIAGNOSIS — I10 Essential (primary) hypertension: Secondary | ICD-10-CM | POA: Diagnosis not present

## 2013-09-21 DIAGNOSIS — F17200 Nicotine dependence, unspecified, uncomplicated: Secondary | ICD-10-CM | POA: Insufficient documentation

## 2013-09-21 MED ORDER — TELMISARTAN 80 MG PO TABS: 80.0000 mg | ORAL_TABLET | Freq: Every day | ORAL | Status: DC

## 2013-09-21 MED ORDER — TIOTROPIUM BROMIDE MONOHYDRATE 2.5 MCG/ACT IN AERS: INHALATION_SPRAY | RESPIRATORY_TRACT | Status: DC

## 2013-09-21 NOTE — Patient Instructions (Addendum)
Stop lisinopril and the smoking  Start micardis 80 one half daily to increase to one daily if needed  spiriva 2 pffs each am  You are cleared for surgery the week of the 23rd of march to let the cigarettes and lisinopril wear off - good luck!

## 2013-09-21 NOTE — Progress Notes (Signed)
   Subjective:    Patient ID: Nathan Pham, male    DOB: 1951-04-08   MRN: [id number]  HPI  44 yowm smoker with good activity tol but noisy breathing since xmas 2014 referred for preop pulmonary eval for back surgery by Dr Rolena Infante  09/21/2013 1st [facility] office visit/ Corda Shutt on ACEi last operation 2012 went fine  Chief Complaint  Patient presents with  . Pulmonary Consult    Referred per Dr. Lin Landsman. Pt needs pulmonary clearance for lower back surgery. He denies any respiratory co's today.   Not limited by breathing from desired activities  As much as by back pain. Has not tried inhalers  Main concern is "noisy breathing" per pt that others have noticed x one year, esp with exertion. Not disturbing sleep  No obvious other patterns in day to day or daytime variabilty or assoc chronic cough or cp or chest tightness, subjective wheeze overt sinus or hb symptoms. No unusual exp hx or h/o childhood pna/ asthma or knowledge of premature birth.  Sleeping ok without nocturnal  or early am exacerbation  of respiratory  c/o's or need for noct saba. Also denies any obvious fluctuation of symptoms with weather or environmental changes or other aggravating or alleviating factors except as outlined above   Current Medications, Allergies, Complete Past Medical History, Past Surgical History, Family History, and Social History were reviewed in [employer] record.           Review of Systems  Constitutional: Negative for fever, chills, activity change, appetite change and unexpected weight change.  HENT: Positive for congestion. Negative for dental problem, postnasal drip, rhinorrhea, sneezing, sore throat, trouble swallowing and voice change.   Eyes: Negative for visual disturbance.  Respiratory: Negative for cough, choking and shortness of breath.   Cardiovascular: Negative for chest pain and leg swelling.  Gastrointestinal: Negative for nausea, vomiting and abdominal  pain.  Genitourinary: Negative for difficulty urinating.  Musculoskeletal: Negative for arthralgias.  Skin: Negative for rash.  Psychiatric/Behavioral: Negative for behavioral problems and confusion.       Objective:   Physical Exam  amb wm with raspy upper airway breathing  Wt Readings from Last 3 Encounters:  09/21/13 196 lb 12.8 oz (89.268 kg)     HEENT mild turbinate edema.  Oropharynx no thrush or excess pnd or cobblestoning.  No JVD or cervical adenopathy. Mild accessory muscle hypertrophy. Trachea midline, nl thryroid. Chest was slt hyperinflated by percussion with diminished breath sounds and mild increased exp time without wheeze. Hoover sign positive at end of  inspiration. Regular rate and rhythm without murmur gallop or rub or increase P2 or edema.  Abd: no hsm, nl excursion. Ext warm without cyanosis or clubbing.          CXR  09/21/2013 :  No active cardiopulmonary disease.      Assessment & Plan:

## 2013-09-22 NOTE — Assessment & Plan Note (Signed)
ACE inhibitors are problematic in  pts with airway complaints because  even experienced pulmonologists can't always distinguish ace effects from copd/asthma.  By themselves they don't actually cause a problem, much like oxygen can't by itself start a fire, but they certainly serve as a powerful catalyst or enhancer for any "fire"  or inflammatory process in the upper airway, be it caused by an ET  tube or more commonly reflux (especially in the obese or pts with known GERD or who are on biphoshonates).    In the era of ARB near equivalency until we have a better handle on the reversibility of the airway problem, it just makes sense to avoid ACEI  entirely in the short run and then decide later, having established a level of airway control using a reasonable limited regimen, whether to add back ace but even then being very careful to observe the pt for worsening airway control and number of meds used/ needed to control symptoms.    For now rx micardis 80 mg one daily periop then reassess post op per Dr Lin Landsman whether to change back to ACEi

## 2013-09-22 NOTE — Assessment & Plan Note (Signed)
>   3 m  I took an extended  opportunity with this patient to outline the consequences of continued cigarette use  in airway disorders based on all the data we have from the multiple national lung health studies (perfomed over decades at millions of dollars in cost)  indicating that smoking cessation, not choice of inhalers or physicians, is the most important aspect of care.    For immediate purposes of back surgery should ideally stop for 2 weeks pre op or take on extra risk of post op complications but I cleared him for surgery for the week of 10/04/13 which should give him plenty of time to quit

## 2013-09-22 NOTE — Assessment & Plan Note (Signed)
-   PFTs Dr Janace Aris office 08/27/13 FEV1  1.37 (38%) ratio 50   As I explained to this patient in detail:  although there may be significant copd present, it does not appear to be limiting activity tolerance any more than a set of worn tires limits someone from driving a car  around a parking lot.  A new set of Michelins might look good but would have no perceived impact on the performance of the car and would not be worth the cost.  However, to get him through surgery, it's reasonable to add spriva respimat and if he notices any change in breathing then fill it going forward.  The proper method of use, as well as anticipated side effects, of a metered-dose inhaler are discussed and demonstrated to the patient. Improved effectiveness after extensive coaching during this visit to a level of approximately  90% from a baseline of < 10%  In addition, would change acei to ARB to see if his raspy breathing resolves as risk it will be much worse with ET trauma from surgery

## 2013-09-23 ENCOUNTER — Telehealth: Payer: Self-pay | Admitting: Internal Medicine

## 2013-09-23 ENCOUNTER — Encounter: Payer: Self-pay | Admitting: *Deleted

## 2013-09-23 NOTE — Telephone Encounter (Signed)
Spoke with the pt  He states that the ov note clearing him for surgery was not sufficient enough  He is requesting that we fax a letter to Dr's Lin Landsman and Dr Rolena Infante with [facility]  Letter created  Both offices closed, will have to call and get the fax numbers tomorrow 09/24/13

## 2013-09-24 NOTE — Telephone Encounter (Signed)
Letter printed and placed for MW to sign.  Fax # is [phone]. Please advise once done MW thanks

## 2013-09-24 NOTE — Telephone Encounter (Signed)
This has been faxed.  Nothing further needed. 

## 2013-10-04 ENCOUNTER — Encounter (HOSPITAL_COMMUNITY): Payer: Self-pay | Admitting: Pharmacy Technician

## 2013-10-11 ENCOUNTER — Encounter (HOSPITAL_COMMUNITY)
Admission: RE | Admit: 2013-10-11 | Discharge: 2013-10-11 | Disposition: A | Discharge status: Home / Self Care | Payer: Medicare Other | Source: Ambulatory Visit | Attending: Orthopedic Surgery | Admitting: Orthopedic Surgery

## 2013-10-11 ENCOUNTER — Encounter (HOSPITAL_COMMUNITY): Payer: Self-pay

## 2013-10-11 DIAGNOSIS — Z01812 Encounter for preprocedural laboratory examination: Secondary | ICD-10-CM | POA: Diagnosis not present

## 2013-10-11 DIAGNOSIS — M47817 Spondylosis without myelopathy or radiculopathy, lumbosacral region: Secondary | ICD-10-CM | POA: Diagnosis not present

## 2013-10-11 DIAGNOSIS — M4310 Spondylolisthesis, site unspecified: Secondary | ICD-10-CM | POA: Diagnosis not present

## 2013-10-11 HISTORY — DX: Chronic obstructive pulmonary disease, unspecified: J44.9

## 2013-10-11 HISTORY — DX: Unspecified osteoarthritis, unspecified site: M19.90

## 2013-10-11 LAB — CBC
HCT: 48.7 % (ref 39.0–52.0)
Hemoglobin: 15.9 g/dL (ref 13.0–17.0)
MCH: 29.7 pg (ref 26.0–34.0)
MCHC: 32.6 g/dL (ref 30.0–36.0)
MCV: 90.9 fL (ref 78.0–100.0)
Platelets: 212 10*3/uL (ref 150–400)
RBC: 5.36 MIL/uL (ref 4.22–5.81)
RDW: 13.7 % (ref 11.5–15.5)
WBC: 8.8 10*3/uL (ref 4.0–10.5)

## 2013-10-11 LAB — SURGICAL PCR SCREEN
MRSA, PCR: NEGATIVE
Staphylococcus aureus: POSITIVE — AB

## 2013-10-11 LAB — BASIC METABOLIC PANEL
BUN: 14 mg/dL (ref 6–23)
CO2: 27 mEq/L (ref 19–32)
Calcium: 9.3 mg/dL (ref 8.4–10.5)
Chloride: 104 mEq/L (ref 96–112)
Creatinine, Ser: 0.88 mg/dL (ref 0.50–1.35)
GFR calc Af Amer: >90 mL/min (ref >90)
Glucose, Bld: 74 mg/dL (ref 70–99)
Potassium: 4 mEq/L (ref 3.7–5.3)
Sodium: 143 mEq/L (ref 137–147)

## 2013-10-11 LAB — TYPE AND SCREEN
ABO/RH(D): A POS
Antibody Screen: NEGATIVE

## 2013-10-11 NOTE — Progress Notes (Signed)
EKG requested from [facility] , [place],CXR requested Dr. G[facility],Cardiac Cath report from [facility] from [facility] ,[place] ,[place]

## 2013-10-11 NOTE — Pre-Procedure Instructions (Signed)
Nathan Pham  10/11/2013   Your procedure is scheduled on:  10-21-2013  Thursday   Report to [facility]  2 * 3 at 5:30 AM.   Call this number if you have problems the morning of surgery: [phone]   Remember:   Do not eat food or drink liquids after midnight.    Take these medicines the morning of surgery with A SIP OF WATER: gabapentin,pain medication as needed,inhaler   Do not wear jewelry,  Do not wear lotions, powders, or perfumes.   Do not shave 48 hours prior to surgery. Men may shave face and neck.  Do not bring valuables to the hospital.  [facility] is not responsible for any belongings or valuables.               Contacts, dentures or bridgework may not be worn into surgery.   Leave suitcase in the car. After surgery it may be brought to your room oom.  For patients admitted to the hospital, discharge time is determined by your  treatment team                                 Patients discharged the day of surgery will not be allowed to drive home.       Please read over the following fact sheets that you were given: Pain Booklet, Coughing and Deep Breathing, Blood Transfusion Information and Surgical Site Infection Prevention

## 2013-10-12 NOTE — Progress Notes (Signed)
Anesthesia Chart Review:  Patient is a 63 year old male scheduled for L4-5 fusion, lateral and posterior on 10/21/13 by Dr Rolena Infante.  History includes smoking, COPD, HLD, CAD/MI s/p Xience stent to PCA '09, prior neck and back surgeries, cholecystectomy, sinus surgery.  PCP is Dr. Lovette Cliche.  He saw patient for pre-operative evaluation on 08/18/13.  He recommended cardiology evaluation and spirometry. Cardiologist is Dr. Danie Binder, last visit 09/15/13 for pre-operative evaluation. He felt patient was low cardiac risk, but suggested pulmonology evaluation which was done by Dr. Melvyn Novas on 09/21/13. Ideally, he recommended patient stop smoking two weeks prior to surgery to decrease post-operative complication risk. See his note in Epic for complete details.  According to Dr. Andrey Campanile 09/15/13 office note:  - EKG on 08/18/13 done at Dr. Janace Aris office showed SR.  Copy requested.  - Myocardial perfusion stress test in 2013 "suggested some diaphragm attenuation but no areas of significant segmental hypoperfusion on reast or pharmacologic stress." Copy requested.  Cardiac cath on 09/16/07 ([facility]) showed 30% proximal CX, 55% mid CX, luminal irregularities and 30% lesion proximal LAD, 30% ostial DIAG, 40% proximal RCA, 25% and 90% mid RCA s/p Xience DES with 0% residual, luminal irregularities with 30% lesion distal RCA. Right dominant. Normal LV systolic function.  CXR on 09/21/13 showed: No active cardiopulmonary disease.   According to Dr. Gustavus Bryant 09/22/13 notes, "- PFTs Dr Redding's office 08/27/13 FEV1 1.37 (38%) ratio 50."  Complete report requested.   Preoperative labs noted.   Patient has been cleared following pulmonology and cardiology evaluations.  He is now on Spiriva and ACEI changed to ARB (at least for now) due to his COPD.  Unfortunately, he continues to smoke which will increase post-operative pulmonary risks.  Further evaluation by his assigned anesthesiologist on the day of surgery. If no  acute cardiopulmonary issues then I would anticipate that he could proceed as planned.  Myra Gianotti, PA-C [facility] Stay Center/Anesthesiology Phone [phone] 10/12/2013 6:00 PM

## 2013-10-14 NOTE — Progress Notes (Signed)
Message left with medical records dept at [facility]  about request for ekg and spirometry.

## 2013-10-20 MED ORDER — ACETAMINOPHEN 10 MG/ML IV SOLN
1000.0000 mg | Freq: Four times a day (QID) | INTRAVENOUS | Status: DC
  Administered 2013-10-21: 1000 mg via INTRAVENOUS
  Filled 2013-10-20: qty 100

## 2013-10-20 MED ORDER — DEXAMETHASONE SODIUM PHOSPHATE 4 MG/ML IJ SOLN
4.0000 mg | Freq: Once | INTRAMUSCULAR | Status: AC
  Administered 2013-10-21: 10 mg via INTRAVENOUS
  Filled 2013-10-20: qty 1

## 2013-10-21 ENCOUNTER — Inpatient Hospital Stay (HOSPITAL_COMMUNITY): Payer: Medicare Other

## 2013-10-21 ENCOUNTER — Inpatient Hospital Stay (HOSPITAL_COMMUNITY): Payer: Medicare Other | Admitting: Anesthesiology

## 2013-10-21 ENCOUNTER — Encounter (HOSPITAL_COMMUNITY): Payer: Self-pay

## 2013-10-21 ENCOUNTER — Encounter (HOSPITAL_COMMUNITY): Payer: Medicare Other | Admitting: Vascular Surgery

## 2013-10-21 ENCOUNTER — Encounter (HOSPITAL_COMMUNITY)
Admission: RE | Disposition: A | Discharge status: Home / Self Care | Payer: Self-pay | Source: Ambulatory Visit | Attending: Orthopedic Surgery

## 2013-10-21 ENCOUNTER — Inpatient Hospital Stay (HOSPITAL_COMMUNITY)
Admission: RE | Admit: 2013-10-21 | Discharge: 2013-10-22 | DRG: 460 | Disposition: A | Discharge status: Home / Self Care | Payer: Medicare Other | Source: Ambulatory Visit | Attending: Orthopedic Surgery | Admitting: Orthopedic Surgery

## 2013-10-21 DIAGNOSIS — G5790 Unspecified mononeuropathy of unspecified lower limb: Secondary | ICD-10-CM | POA: Diagnosis present

## 2013-10-21 DIAGNOSIS — M48061 Spinal stenosis, lumbar region without neurogenic claudication: Secondary | ICD-10-CM | POA: Diagnosis not present

## 2013-10-21 DIAGNOSIS — M5137 Other intervertebral disc degeneration, lumbosacral region: Secondary | ICD-10-CM | POA: Diagnosis not present

## 2013-10-21 DIAGNOSIS — M549 Dorsalgia, unspecified: Secondary | ICD-10-CM | POA: Diagnosis present

## 2013-10-21 DIAGNOSIS — F17200 Nicotine dependence, unspecified, uncomplicated: Secondary | ICD-10-CM | POA: Diagnosis present

## 2013-10-21 DIAGNOSIS — J449 Chronic obstructive pulmonary disease, unspecified: Secondary | ICD-10-CM | POA: Diagnosis not present

## 2013-10-21 DIAGNOSIS — I252 Old myocardial infarction: Secondary | ICD-10-CM

## 2013-10-21 DIAGNOSIS — M51379 Other intervertebral disc degeneration, lumbosacral region without mention of lumbar back pain or lower extremity pain: Principal | ICD-10-CM | POA: Diagnosis present

## 2013-10-21 DIAGNOSIS — Z981 Arthrodesis status: Secondary | ICD-10-CM | POA: Diagnosis not present

## 2013-10-21 DIAGNOSIS — I10 Essential (primary) hypertension: Secondary | ICD-10-CM | POA: Diagnosis present

## 2013-10-21 DIAGNOSIS — Z8249 Family history of ischemic heart disease and other diseases of the circulatory system: Secondary | ICD-10-CM | POA: Diagnosis not present

## 2013-10-21 DIAGNOSIS — M961 Postlaminectomy syndrome, not elsewhere classified: Secondary | ICD-10-CM | POA: Diagnosis present

## 2013-10-21 DIAGNOSIS — J4489 Other specified chronic obstructive pulmonary disease: Secondary | ICD-10-CM | POA: Diagnosis not present

## 2013-10-21 DIAGNOSIS — M519 Unspecified thoracic, thoracolumbar and lumbosacral intervertebral disc disorder: Secondary | ICD-10-CM | POA: Diagnosis not present

## 2013-10-21 HISTORY — PX: ANTERIOR LAT LUMBAR FUSION: SHX1168

## 2013-10-21 LAB — SURGICAL PCR SCREEN
MRSA, PCR: NEGATIVE
STAPHYLOCOCCUS AUREUS: NEGATIVE

## 2013-10-21 SURGERY — ANTERIOR LATERAL LUMBAR FUSION 1 LEVEL
Anesthesia: General | Site: Spine Lumbar

## 2013-10-21 MED ORDER — PROPOFOL 10 MG/ML IV BOLUS
INTRAVENOUS | Status: AC
  Filled 2013-10-21: qty 20

## 2013-10-21 MED ORDER — OXYCODONE HCL 5 MG PO TABS: 5.0000 mg | ORAL_TABLET | Freq: Once | ORAL | Status: DC | PRN

## 2013-10-21 MED ORDER — SODIUM CHLORIDE 0.9 % IJ SOLN: 9.0000 mL | INTRAMUSCULAR | Status: DC | PRN

## 2013-10-21 MED ORDER — BUPIVACAINE-EPINEPHRINE 0.25% -1:200000 IJ SOLN
INTRAMUSCULAR | Status: DC | PRN
  Administered 2013-10-21: 10 mL

## 2013-10-21 MED ORDER — MORPHINE SULFATE (PF) 1 MG/ML IV SOLN
INTRAVENOUS | Status: AC
  Filled 2013-10-21: qty 25

## 2013-10-21 MED ORDER — FENTANYL CITRATE 0.05 MG/ML IJ SOLN
INTRAMUSCULAR | Status: DC | PRN
  Administered 2013-10-21: 100 ug via INTRAVENOUS
  Administered 2013-10-21: 50 ug via INTRAVENOUS
  Administered 2013-10-21 (×3): 100 ug via INTRAVENOUS
  Administered 2013-10-21: 200 ug via INTRAVENOUS
  Administered 2013-10-21: 100 ug via INTRAVENOUS

## 2013-10-21 MED ORDER — HEMOSTATIC AGENTS (NO CHARGE) OPTIME
TOPICAL | Status: DC | PRN
  Administered 2013-10-21: 1 via TOPICAL

## 2013-10-21 MED ORDER — IRBESARTAN 75 MG PO TABS
75.0000 mg | ORAL_TABLET | Freq: Every day | ORAL | Status: DC
  Administered 2013-10-21 – 2013-10-22 (×2): 75 mg via ORAL
  Filled 2013-10-21 (×2): qty 1

## 2013-10-21 MED ORDER — THROMBIN 20000 UNITS EX SOLR
CUTANEOUS | Status: AC
  Filled 2013-10-21: qty 20000

## 2013-10-21 MED ORDER — MIDAZOLAM HCL 5 MG/5ML IJ SOLN
INTRAMUSCULAR | Status: DC | PRN
  Administered 2013-10-21: 2 mg via INTRAVENOUS

## 2013-10-21 MED ORDER — SODIUM CHLORIDE 0.9 % IJ SOLN: 3.0000 mL | INTRAMUSCULAR | Status: DC | PRN

## 2013-10-21 MED ORDER — MIDAZOLAM HCL 2 MG/2ML IJ SOLN
INTRAMUSCULAR | Status: AC
  Filled 2013-10-21: qty 2

## 2013-10-21 MED ORDER — LACTATED RINGERS IV SOLN: INTRAVENOUS | Status: DC

## 2013-10-21 MED ORDER — CHLORHEXIDINE GLUCONATE CLOTH 2 % EX PADS: 6.0000 | MEDICATED_PAD | Freq: Every day | CUTANEOUS | Status: DC

## 2013-10-21 MED ORDER — LACTATED RINGERS IV SOLN
INTRAVENOUS | Status: DC | PRN
  Administered 2013-10-21 (×4): via INTRAVENOUS

## 2013-10-21 MED ORDER — OXYCODONE HCL 5 MG PO TABS
10.0000 mg | ORAL_TABLET | ORAL | Status: DC | PRN
  Administered 2013-10-22: 10 mg via ORAL
  Filled 2013-10-21: qty 2

## 2013-10-21 MED ORDER — DIPHENHYDRAMINE HCL 50 MG/ML IJ SOLN
12.5000 mg | Freq: Four times a day (QID) | INTRAMUSCULAR | Status: DC | PRN
  Filled 2013-10-21: qty 0.25

## 2013-10-21 MED ORDER — HYDROMORPHONE HCL PF 1 MG/ML IJ SOLN
0.2500 mg | INTRAMUSCULAR | Status: DC | PRN
  Administered 2013-10-21 (×4): 0.5 mg via INTRAVENOUS

## 2013-10-21 MED ORDER — OXYCODONE HCL 5 MG/5ML PO SOLN: 5.0000 mg | Freq: Once | ORAL | Status: DC | PRN

## 2013-10-21 MED ORDER — VANCOMYCIN HCL IN DEXTROSE 1-5 GM/200ML-% IV SOLN
1000.0000 mg | Freq: Two times a day (BID) | INTRAVENOUS | Status: AC
  Administered 2013-10-21 – 2013-10-22 (×2): 1000 mg via INTRAVENOUS
  Filled 2013-10-21 (×2): qty 200

## 2013-10-21 MED ORDER — PHENOL 1.4 % MT LIQD: 1.0000 | OROMUCOSAL | Status: DC | PRN

## 2013-10-21 MED ORDER — NALOXONE HCL 0.4 MG/ML IJ SOLN
0.4000 mg | INTRAMUSCULAR | Status: DC | PRN
  Filled 2013-10-21: qty 1

## 2013-10-21 MED ORDER — DEXAMETHASONE SODIUM PHOSPHATE 4 MG/ML IJ SOLN
INTRAMUSCULAR | Status: AC
  Filled 2013-10-21: qty 1

## 2013-10-21 MED ORDER — SUCCINYLCHOLINE CHLORIDE 20 MG/ML IJ SOLN
INTRAMUSCULAR | Status: AC
  Filled 2013-10-21: qty 1

## 2013-10-21 MED ORDER — ONDANSETRON HCL 4 MG/2ML IJ SOLN
INTRAMUSCULAR | Status: AC
Start: 2013-10-21 — End: 2013-10-21
  Filled 2013-10-21: qty 2

## 2013-10-21 MED ORDER — FENTANYL CITRATE 0.05 MG/ML IJ SOLN
INTRAMUSCULAR | Status: AC
  Filled 2013-10-21: qty 5

## 2013-10-21 MED ORDER — LIDOCAINE HCL (CARDIAC) 20 MG/ML IV SOLN
INTRAVENOUS | Status: DC | PRN
  Administered 2013-10-21: 80 mg via INTRAVENOUS

## 2013-10-21 MED ORDER — PHENYLEPHRINE 40 MCG/ML (10ML) SYRINGE FOR IV PUSH (FOR BLOOD PRESSURE SUPPORT)
PREFILLED_SYRINGE | INTRAVENOUS | Status: AC
  Filled 2013-10-21: qty 10

## 2013-10-21 MED ORDER — VANCOMYCIN HCL IN DEXTROSE 1-5 GM/200ML-% IV SOLN
INTRAVENOUS | Status: AC
  Administered 2013-10-21: 1000 mg via INTRAVENOUS
  Filled 2013-10-21: qty 200

## 2013-10-21 MED ORDER — DEXAMETHASONE 4 MG PO TABS
4.0000 mg | ORAL_TABLET | Freq: Four times a day (QID) | ORAL | Status: DC
  Administered 2013-10-21 – 2013-10-22 (×3): 4 mg via ORAL
  Filled 2013-10-21 (×7): qty 1

## 2013-10-21 MED ORDER — AMITRIPTYLINE HCL 25 MG PO TABS
25.0000 mg | ORAL_TABLET | Freq: Every day | ORAL | Status: DC
  Administered 2013-10-21: 25 mg via ORAL
  Filled 2013-10-21 (×2): qty 2

## 2013-10-21 MED ORDER — ROCURONIUM BROMIDE 50 MG/5ML IV SOLN
INTRAVENOUS | Status: AC
  Filled 2013-10-21: qty 1

## 2013-10-21 MED ORDER — MORPHINE SULFATE (PF) 1 MG/ML IV SOLN
INTRAVENOUS | Status: DC
  Administered 2013-10-21 (×2): 3 mg via INTRAVENOUS
  Administered 2013-10-21: 5 mg via INTRAVENOUS
  Administered 2013-10-21: 11:00:00 via INTRAVENOUS
  Administered 2013-10-22: 2 mg via INTRAVENOUS
  Administered 2013-10-22: 1 mg via INTRAVENOUS

## 2013-10-21 MED ORDER — LIDOCAINE HCL (CARDIAC) 20 MG/ML IV SOLN
INTRAVENOUS | Status: AC
  Filled 2013-10-21: qty 5

## 2013-10-21 MED ORDER — ONDANSETRON HCL 4 MG/2ML IJ SOLN
4.0000 mg | Freq: Four times a day (QID) | INTRAMUSCULAR | Status: DC | PRN
  Filled 2013-10-21: qty 2

## 2013-10-21 MED ORDER — BUPIVACAINE-EPINEPHRINE (PF) 0.25% -1:200000 IJ SOLN
INTRAMUSCULAR | Status: AC
  Filled 2013-10-21: qty 30

## 2013-10-21 MED ORDER — METHOCARBAMOL 500 MG PO TABS
500.0000 mg | ORAL_TABLET | Freq: Four times a day (QID) | ORAL | Status: DC | PRN
  Administered 2013-10-22: 500 mg via ORAL
  Filled 2013-10-21 (×2): qty 1

## 2013-10-21 MED ORDER — ONDANSETRON HCL 4 MG/2ML IJ SOLN: 4.0000 mg | INTRAMUSCULAR | Status: DC | PRN

## 2013-10-21 MED ORDER — DEXAMETHASONE SODIUM PHOSPHATE 4 MG/ML IJ SOLN
4.0000 mg | Freq: Four times a day (QID) | INTRAMUSCULAR | Status: DC
  Administered 2013-10-21: 4 mg via INTRAVENOUS
  Filled 2013-10-21 (×7): qty 1

## 2013-10-21 MED ORDER — PROPOFOL 10 MG/ML IV BOLUS
INTRAVENOUS | Status: DC | PRN
  Administered 2013-10-21: 200 mg via INTRAVENOUS

## 2013-10-21 MED ORDER — ACETAMINOPHEN 10 MG/ML IV SOLN
1000.0000 mg | Freq: Four times a day (QID) | INTRAVENOUS | Status: AC
  Administered 2013-10-21 – 2013-10-22 (×4): 1000 mg via INTRAVENOUS
  Filled 2013-10-21 (×4): qty 100

## 2013-10-21 MED ORDER — HYDROMORPHONE HCL PF 1 MG/ML IJ SOLN
INTRAMUSCULAR | Status: AC
  Filled 2013-10-21: qty 1

## 2013-10-21 MED ORDER — MUPIROCIN 2 % EX OINT
1.0000 "application " | TOPICAL_OINTMENT | Freq: Two times a day (BID) | CUTANEOUS | Status: DC
  Filled 2013-10-21: qty 22

## 2013-10-21 MED ORDER — SODIUM CHLORIDE 0.9 % IV SOLN: 250.0000 mL | INTRAVENOUS | Status: DC

## 2013-10-21 MED ORDER — GABAPENTIN 600 MG PO TABS
600.0000 mg | ORAL_TABLET | Freq: Three times a day (TID) | ORAL | Status: DC
  Filled 2013-10-21 (×5): qty 1

## 2013-10-21 MED ORDER — NITROGLYCERIN 0.4 MG SL SUBL: 0.4000 mg | SUBLINGUAL_TABLET | SUBLINGUAL | Status: DC | PRN

## 2013-10-21 MED ORDER — DEXAMETHASONE SODIUM PHOSPHATE 4 MG/ML IJ SOLN
INTRAMUSCULAR | Status: AC
  Filled 2013-10-21: qty 2

## 2013-10-21 MED ORDER — 0.9 % SODIUM CHLORIDE (POUR BTL) OPTIME
TOPICAL | Status: DC | PRN
  Administered 2013-10-21: 1000 mL

## 2013-10-21 MED ORDER — PHENYLEPHRINE HCL 10 MG/ML IJ SOLN
INTRAMUSCULAR | Status: DC | PRN
  Administered 2013-10-21 (×7): 80 ug via INTRAVENOUS

## 2013-10-21 MED ORDER — THROMBIN 20000 UNITS EX SOLR
CUTANEOUS | Status: DC | PRN
  Administered 2013-10-21: 09:00:00 via TOPICAL

## 2013-10-21 MED ORDER — METHOCARBAMOL 100 MG/ML IJ SOLN
500.0000 mg | Freq: Four times a day (QID) | INTRAVENOUS | Status: DC | PRN
  Filled 2013-10-21: qty 5

## 2013-10-21 MED ORDER — DIPHENHYDRAMINE HCL 12.5 MG/5ML PO ELIX
12.5000 mg | ORAL_SOLUTION | Freq: Four times a day (QID) | ORAL | Status: DC | PRN
  Filled 2013-10-21: qty 5

## 2013-10-21 MED ORDER — PROMETHAZINE HCL 25 MG/ML IJ SOLN: 6.2500 mg | INTRAMUSCULAR | Status: DC | PRN

## 2013-10-21 MED ORDER — TIOTROPIUM BROMIDE MONOHYDRATE 18 MCG IN CAPS
18.0000 ug | ORAL_CAPSULE | Freq: Every day | RESPIRATORY_TRACT | Status: DC
  Administered 2013-10-22: 18 ug via RESPIRATORY_TRACT
  Filled 2013-10-21: qty 5

## 2013-10-21 MED ORDER — TIOTROPIUM BROMIDE MONOHYDRATE 2.5 MCG/ACT IN AERS: 2.0000 | INHALATION_SPRAY | Freq: Every day | RESPIRATORY_TRACT | Status: DC

## 2013-10-21 MED ORDER — ONDANSETRON HCL 4 MG/2ML IJ SOLN
INTRAMUSCULAR | Status: DC | PRN
  Administered 2013-10-21: 4 mg via INTRAVENOUS

## 2013-10-21 MED ORDER — SODIUM CHLORIDE 0.9 % IJ SOLN: 3.0000 mL | Freq: Two times a day (BID) | INTRAMUSCULAR | Status: DC

## 2013-10-21 MED ORDER — MENTHOL 3 MG MT LOZG: 1.0000 | LOZENGE | OROMUCOSAL | Status: DC | PRN

## 2013-10-21 MED ORDER — PROPOFOL INFUSION 10 MG/ML OPTIME
INTRAVENOUS | Status: DC | PRN
  Administered 2013-10-21: 100 ug/kg/min via INTRAVENOUS

## 2013-10-21 MED ORDER — SUCCINYLCHOLINE CHLORIDE 20 MG/ML IJ SOLN
INTRAMUSCULAR | Status: DC | PRN
  Administered 2013-10-21: 100 mg via INTRAVENOUS

## 2013-10-21 SURGICAL SUPPLY — 76 items
BLADE SURG 10 STRL SS (BLADE) ×3 IMPLANT
BLADE SURG ROTATE 9660 (MISCELLANEOUS) IMPLANT
BONE MATRIX OSTEOCEL PRO MED (Bone Implant) ×2 IMPLANT
CAGE COROENT XL 10X18X60 (Cage) ×1 IMPLANT
CAGE COROENT XL 10X18X60MM (Cage) ×1 IMPLANT
CLOSURE STERI-STRIP 1/2X4 (GAUZE/BANDAGES/DRESSINGS) ×2
CLOSURE WOUND 1/2 X4 (GAUZE/BANDAGES/DRESSINGS)
CLSR STERI-STRIP ANTIMIC 1/2X4 (GAUZE/BANDAGES/DRESSINGS) ×2 IMPLANT
CORDS BIPOLAR (ELECTRODE) ×3 IMPLANT
COVER SURGICAL LIGHT HANDLE (MISCELLANEOUS) ×3 IMPLANT
DRAPE C-ARM 42X72 X-RAY (DRAPES) ×3 IMPLANT
DRAPE INCISE IOBAN 66X45 STRL (DRAPES) ×3 IMPLANT
DRAPE ORTHO SPLIT 77X108 STRL (DRAPES) ×3
DRAPE POUCH INSTRU U-SHP 10X18 (DRAPES) ×3 IMPLANT
DRAPE SURG ORHT 6 SPLT 77X108 (DRAPES) ×1 IMPLANT
DRAPE U-SHAPE 47X51 STRL (DRAPES) ×6 IMPLANT
DRSG MEPILEX BORDER 4X4 (GAUZE/BANDAGES/DRESSINGS) ×2 IMPLANT
DRSG MEPILEX BORDER 4X8 (GAUZE/BANDAGES/DRESSINGS) ×5 IMPLANT
DURAPREP 26ML APPLICATOR (WOUND CARE) ×3 IMPLANT
ELECT BLADE 4.0 EZ CLEAN MEGAD (MISCELLANEOUS) ×3
ELECT REM PT RETURN 9FT ADLT (ELECTROSURGICAL) ×3
ELECTRODE BLDE 4.0 EZ CLN MEGD (MISCELLANEOUS) ×1 IMPLANT
ELECTRODE REM PT RTRN 9FT ADLT (ELECTROSURGICAL) ×1 IMPLANT
GAUZE SPONGE 4X4 16PLY XRAY LF (GAUZE/BANDAGES/DRESSINGS) ×3 IMPLANT
GLOVE BIOGEL PI IND STRL 8 (GLOVE) ×1 IMPLANT
GLOVE BIOGEL PI IND STRL 8.5 (GLOVE) ×1 IMPLANT
GLOVE BIOGEL PI INDICATOR 8 (GLOVE) ×2
GLOVE BIOGEL PI INDICATOR 8.5 (GLOVE) ×2
GLOVE ECLIPSE 8.5 STRL (GLOVE) ×3 IMPLANT
GLOVE ORTHO TXT STRL SZ7.5 (GLOVE) ×3 IMPLANT
GOWN STRL REUS W/ TWL XL LVL3 (GOWN DISPOSABLE) ×2 IMPLANT
GOWN STRL REUS W/TWL 2XL LVL3 (GOWN DISPOSABLE) ×6 IMPLANT
GOWN STRL REUS W/TWL XL LVL3 (GOWN DISPOSABLE) ×6
GUIDEWIRE NITINOL BEVEL TIP (WIRE) ×4 IMPLANT
KIT BASIN OR (CUSTOM PROCEDURE TRAY) ×3 IMPLANT
KIT DILATOR XLIF 5 (KITS) IMPLANT
KIT MAXCESS (KITS) ×2 IMPLANT
KIT NDL NVM5 EMG ELECT (KITS) IMPLANT
KIT NEEDLE NVM5 EMG ELECT (KITS) ×1 IMPLANT
KIT NEEDLE NVM5 EMG ELECTRODE (KITS) ×2
KIT ROOM TURNOVER OR (KITS) ×3 IMPLANT
KIT XLIF (KITS) ×2
NDL I-PASS III (NEEDLE) IMPLANT
NDL SPNL 18GX3.5 QUINCKE PK (NEEDLE) ×1 IMPLANT
NEEDLE 22X1 1/2 (OR ONLY) (NEEDLE) ×3 IMPLANT
NEEDLE I-PASS III (NEEDLE) ×3 IMPLANT
NEEDLE SPNL 18GX3.5 QUINCKE PK (NEEDLE) ×3 IMPLANT
NS IRRIG 1000ML POUR BTL (IV SOLUTION) ×3 IMPLANT
PACK LAMINECTOMY ORTHO (CUSTOM PROCEDURE TRAY) ×3 IMPLANT
PACK UNIVERSAL I (CUSTOM PROCEDURE TRAY) ×3 IMPLANT
PAD ARMBOARD 7.5X6 YLW CONV (MISCELLANEOUS) ×6 IMPLANT
PUTTY BONE DBX 2.5 MIS (Bone Implant) ×2 IMPLANT
ROD 50MM (Rod) ×2 IMPLANT
SCREW PRECEPT 6.5X45 (Screw) ×4 IMPLANT
SCREW PRECEPT SET (Screw) ×4 IMPLANT
SPONGE LAP 4X18 X RAY DECT (DISPOSABLE) ×3 IMPLANT
SPONGE SURGIFOAM ABS GEL 100 (HEMOSTASIS) ×3 IMPLANT
STAPLER VISISTAT 35W (STAPLE) ×3 IMPLANT
STRIP CLOSURE SKIN 1/2X4 (GAUZE/BANDAGES/DRESSINGS) IMPLANT
SURGIFLO TRUKIT (HEMOSTASIS) ×2 IMPLANT
SUT MON AB 3-0 SH 27 (SUTURE) ×6
SUT MON AB 3-0 SH27 (SUTURE) ×2 IMPLANT
SUT VIC AB 0 CT1 18XCR BRD 8 (SUTURE) IMPLANT
SUT VIC AB 0 CT1 8-18 (SUTURE) ×3
SUT VIC AB 1 CT1 27 (SUTURE) ×3
SUT VIC AB 1 CT1 27XBRD ANBCTR (SUTURE) ×2 IMPLANT
SUT VIC AB 1 CTX 36 (SUTURE)
SUT VIC AB 1 CTX36XBRD ANBCTR (SUTURE) ×2 IMPLANT
SUT VIC AB 2-0 CT1 18 (SUTURE) ×4 IMPLANT
SYR BULB IRRIGATION 50ML (SYRINGE) ×3 IMPLANT
SYR CONTROL 10ML LL (SYRINGE) ×3 IMPLANT
TAPE CLOTH 4X10 WHT NS (GAUZE/BANDAGES/DRESSINGS) ×6 IMPLANT
TOWEL OR 17X24 6PK STRL BLUE (TOWEL DISPOSABLE) ×3 IMPLANT
TOWEL OR 17X26 10 PK STRL BLUE (TOWEL DISPOSABLE) ×3 IMPLANT
TRAY FOLEY CATH 14FRSI W/METER (CATHETERS) ×3 IMPLANT
WATER STERILE IRR 1000ML POUR (IV SOLUTION) ×1 IMPLANT

## 2013-10-21 NOTE — Anesthesia Postprocedure Evaluation (Signed)
  Anesthesia Post-op Note  Patient: Nathan Pham  Procedure(s) Performed: Procedure(s): ANTERIOR LATERAL LUMBAR FUSION 1 LEVEL(XLIF) L4-5 (N/A)  Patient Location: PACU  Anesthesia Type:General  Level of Consciousness: awake and alert   Airway and Oxygen Therapy: Patient Spontanous Breathing  Post-op Pain: moderate  Post-op Assessment: Post-op Vital signs reviewed  Post-op Vital Signs: stable  Last Vitals:  Filed Vitals:   10/21/13 1200  BP: 116/85  Pulse: 72  Temp:   Resp: 8    Complications: No apparent anesthesia complications

## 2013-10-21 NOTE — Plan of Care (Signed)
Problem: Consults Goal: Diagnosis - Spinal Surgery Thoraco/Lumbar Spine Fusion Anterior Lateral L4-L5

## 2013-10-21 NOTE — Anesthesia Preprocedure Evaluation (Addendum)
Anesthesia Evaluation  Patient identified by MRN, date of birth, ID band Patient awake    Reviewed: Allergy & Precautions, H&P , NPO status , Patient's Chart, lab work & pertinent test results  Airway Mallampati: II  Neck ROM: Full    Dental  (+) Edentulous Upper   Pulmonary COPDCurrent Smoker,  breath sounds clear to auscultation        Cardiovascular hypertension, + Past MI and + Cardiac Stents Rhythm:Regular Rate:Normal     Neuro/Psych    GI/Hepatic negative GI ROS, Neg liver ROS,   Endo/Other  negative endocrine ROS  Renal/GU negative Renal ROS     Musculoskeletal   Abdominal   Peds  Hematology   Anesthesia Other Findings   Reproductive/Obstetrics                          Anesthesia Physical Anesthesia Plan  ASA: III  Anesthesia Plan: General   Post-op Pain Management:    Induction: Intravenous  Airway Management Planned: Oral ETT  Additional Equipment:   Intra-op Plan:   Post-operative Plan: Extubation in OR  Informed Consent: I have reviewed the patients History and Physical, chart, labs and discussed the procedure including the risks, benefits and alternatives for the proposed anesthesia with the patient or authorized representative who has indicated his/her understanding and acceptance.   Dental advisory given  Plan Discussed with: CRNA and Surgeon  Anesthesia Plan Comments:         Anesthesia Quick Evaluation

## 2013-10-21 NOTE — H&P (Signed)
History of Present Illness  The patient is a 63 year old male who comes in today for a preoperative History and Physical. The patient is scheduled for a XLIF L4-5 to be performed by Dr. Duane Lope D. Rolena Infante, MD at [facility] on 10/21/2013 . Please see the hospital record for complete dictated history and physical.  Additional reason for visit:  Follow-up backis described as the following: The patient is being followed for their back pain. They are now 2 year(s) (since SCS) out. Symptoms reported today include: pain (right leg), aching, throbbing, stiffness, weakness, numbness and burning (right great toe), while the patient does not report symptoms of: urinary incontinence. The patient states that they are doing poorly. Current treatment includes: pain medications. The following medication has been used for pain control: Oxycodone (10mg  qid and Oxycontin 30mg  tid Rx'd by Dr Nelva Bush) and Gabapentin. The patient reports their current pain level to be 6 / 10. The patient has not gotten any relief of their symptoms with Cortisone injections (injections by Dr Vira Blanco). The patient indicates that they have questions or concerns today regarding surgery.    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 He states his symptoms are unchanged from the previous visit. He wishes to proceed with L4-5 XLIF as scheduled.  He received pre-operative pulmonary clearance from Dr. Melvyn Novas.   Allergies No Known Allergies. 02/25/2011    Family History Heart disease in male family member before age 29 Hypertension. mother Heart Disease. Mother. mother    Social History Number of flights of stairs before winded. greater than 5 Marital status. single Illicit drug use. no Tobacco use. Current some day smoker. current some days smoker; smoke(d) less than 1/2 pack(s)  per day Tobacco / smoke exposure. yes outdoors only Pain Contract. yes Exercise. Exercises daily; does running / walking and individual sport Children. 3 Alcohol use. former drinker No alcohol use Drug/Alcohol Rehab (Previously). no Drug/Alcohol Rehab (Currently). no Current work status. disabled    Medication History OxyCODONE HCl (10MG  Tablet, Oral) Active. (QID) Gabapentin (400MG  Capsule, Oral) Active. (1 QAM, 1 LUNCH, 2 QHS) Amitriptyline HCl (10MG  Tablet, Oral as needed) Active. (PRN QHS) Pravastatin Sodium (40MG  Tablet, Oral) Active. (QD) Nitrostat ( Sublingual) Specific dose unknown - Active. (PRN) Aspirin EC (325MG  Tablet DR, Oral) Active. (QD) Lisinopril ( Oral) Specific dose unknown - Active. (QD//DR Valentina.Bachelor D/C'D 3 WEEKS AGO DUE TO UPCOMING SURGERY PER PT) OxyCONTIN (30MG  Tab 12HR Deter, 1 Oral three times daily, as needed) Active. Micardis (80MG  Tablet, Oral) Active. (1/2 TAB QD) Medications Reconciled.    Vitals 10/12/2013 9:02 AM Weight: 207 lb Height: 70.5 in Body Surface Area: 2.16 m Body Mass Index: 29.28 kg/m Temp.: 97.6 FPulse: 83 (Regular) BP: 157/103 (Sitting, Left Arm, Standard)     Objective Transcription  He is a 63 year old white male who is alert and oriented times three, in no acute distress. Gait is normal.. Head is normocephalic, atraumatic. PERRLA. EOMI Oral mucosa is pink and moist. Cervical spine is unremarkable. Lungs reveal a few scattered wheezes on the right. Breath sounds are also somewhat diminished. Heart RRR. No murmurs. Abdomen is round and non-distended. NBS times four. Soft and tender. No increase in respiratory effort.  On physical exam today, he's alert, oriented times 3. No shortness of breath or chest pain. Abdomen is soft and nontender. Horrific pain with palpation, ROM, palpation of the  lumbar spine. Radicular neuropathic pain in the right leg. He is walking with a cane, but he is grossly  neurologically intact. No significant hip, knee or ankle pain with ROM. Intact, but diminished, peripheral pulses symmetrically in the lower extremity. Compartments are soft and nontender. No incontinence of bowel or bladder.   Assessment & Plan  We will proceed with surgery as scheduled. The surgical procedure, recovery and potential rehab time was discussed in him in detail. In regards to his wheezes on the right, the patient states he does have a history of COPD. We will review his pre-operative chest X-ray.  Given the calcifications I see in his descending vasculatory system and the fact that there is scar tissue from the ALIF at 5-1, I am reluctant to recommend an anterior approach for him. I think the stress on the retraction that is needed on the vascular system may be too much and he's having an increased of thromboembolic disease. I think a better option would be a lateral fusion. This provides essentially a virgin plane to remove the bulk of the disc and place an interbody spacer to re-expand the space. I would then supplement this with a lateral cage. We have also indicated that, 3-6 months after this surgery, if the radicular leg pain is not significantly improved, I would go into the posterior, remove the right-sided pedicle screw construct at L5-S1 and then do a revision decompression at 4-5 and potentially also at 5-1. At this point, I don't think doing a lateral and posterior and single operation is the best option for him. There is an excellent chance that with indirect decompression, the radicular right leg pain could resolve and we won't need to do anything posteriorly. I have reviewed the risks with him which include infection, bleeding, nerve damage, death, stroke, paralysis, failure to heal, need for further surgery, ongoing or worse pain, loss of bowel or bladder control, injury to the lumbar plexus, permanent or temporary hip pain, groin pain, weakness in the hip  flexors which would make walking difficult. He has expressed an understanding of these risks, and we will plan for surgery after he gets medical clearance in the near future.  In addition to the surgical procedure, postoperatively he will need an LSO brace as well as a bone stimulator, given the fact that this is a revision case at the 4-5 level. His diagnosis is adjacent segment degenerative disease along with post-laminectomy syndrome, all at L4-5. I do think that the need for this surgery is causally related to his injury. As a result of his work injury, he required an L4-5 and L5-S1 decompression which led to advancement of his degenerative disease and the ultimate need for fusion at the 4-5 level. In addition, the 5-1 fusion that he had in 2010 has produced adjacent segment degenerative disease which is at the L4-5 level and is also necessitating surgery.

## 2013-10-21 NOTE — Brief Op Note (Signed)
10/21/2013  10:56 AM  PATIENT:  Nathan Pham  63 y.o. 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-5  PROCEDURE:  Procedure(s): ANTERIOR LATERAL LUMBAR FUSION 1 LEVEL(XLIF) L4-5 (N/A)  SURGEON:  Surgeon(s) and Role:    * Melina Schools, MD - Primary  PHYSICIAN ASSISTANT:   ASSISTANTS: Benjiman Core    ANESTHESIA:   general  EBL:  Total I/O In: 3000 [I.V.:3000] Out: 150 [Urine:150]  BLOOD ADMINISTERED:none  DRAINS: none   LOCAL MEDICATIONS USED:  MARCAINE     SPECIMEN:  No Specimen  DISPOSITION OF SPECIMEN:  N/A  COUNTS:  YES  TOURNIQUET:  * No tourniquets in log *  DICTATION: .Other Dictation: Dictation Number [id number]  PLAN OF CARE: Admit to inpatient   PATIENT DISPOSITION:  PACU - hemodynamically stable.

## 2013-10-21 NOTE — Transfer of Care (Signed)
Immediate Anesthesia Transfer of Care Note  Patient: Nathan Pham  Procedure(s) Performed: Procedure(s): ANTERIOR LATERAL LUMBAR FUSION 1 LEVEL(XLIF) L4-5 (N/A)  Patient Location: PACU  Anesthesia Type:General  Level of Consciousness: sedated, patient cooperative and responds to stimulation  Airway & Oxygen Therapy: Patient Spontanous Breathing and Patient connected to nasal cannula oxygen  Post-op Assessment: Report given to PACU RN, Post -op Vital signs reviewed and stable and Patient moving all extremities X 4  Post vital signs: Reviewed and stable  Complications: No apparent anesthesia complications

## 2013-10-21 NOTE — Progress Notes (Signed)
Utilization review completed.  

## 2013-10-21 NOTE — Op Note (Signed)
NAME:  AURTHER, HARLIN NO.:  [id number]  MEDICAL RECORD NO.:  [id number]  LOCATION:  [id number]                        FACILITY:  [facility]  PHYSICIAN:  Dahlia Bailiff, MD    DATE OF BIRTH:  1951/04/16  DATE OF PROCEDURE:  10/21/2013 DATE OF DISCHARGE:                              OPERATIVE REPORT   FIRST ASSISTANT:  Alyson Locket. Ricard Dillon, [place]  PREOPERATIVE DIAGNOSIS:  Adjacent segment degenerative disk disease with recurrent spinal stenosis.  POSTOPERATIVE DIAGNOSIS:  Adjacent segment degenerative disk disease with recurrent spinal stenosis.  OPERATIVE PROCEDURES: 1. Lateral interbody fusion L4-5. 2. Posterior supplemental pedicle screw fixation for fusion, L4-5.  COMPLICATIONS:  None.  CONDITION:  Stable.  INSTRUMENTATION SYSTEM USED:  The NuVasive size 10 x 18 parallel cage packed with Osteocel and DBX mix, and 45 mm, 6.5 diameter pedicle screws.  Intraoperative EMG nerve conduction, SSEP monitoring was normal throughout the entire procedure.  OPERATIVE HISTORY:  This is a very pleasant 63 year old gentleman who had an L4-5, 5-1 previous decompression as well as an anterior lumbar L5- S1 fusion.  The patient did well for considerable amount of time and then had recurrent spinal stenosis and significant back and buttock and right leg pain.  Adjacent segment degeneration was noted.  Having failed conservative management, we elected to proceed with surgery.  All appropriate risks, benefits, and alternatives were discussed.  OPERATIVE NOTE:  The patient was brought to the operating room, placed supine on the operating table.  After successful induction of general anesthesia and endotracheal intubation, TEDs, SCDs, and a Foley were inserted.  The patient was then turned into the lateral decubitus position, left side up.  Axillary roll was placed.  All appropriate needles for EMG monitoring were attached.  The down leg was then padded at the knee in the ankle.   Pillows were placed between the legs and the patient was taped down to the table for security.  At this point, x-rays confirmed satisfactory positioning in the AP and lateral planes.  Once this was completed, I then prepped and draped the lateral and posterior aspect of the spine.  The patient was then prepped.  After prep and drape, we then did a time-out confirming patient, procedure, and all other pertinent important data.  Once this was completed, we then proceeded with the surgery.  I identified the inferior aspect of the L4 vertebral body, and then marked by incision site.  I infiltrated this with 0.25% Marcaine with epinephrine.  I then made an incision over the L4 vertebral body, and dissected down to the fascia of the external oblique.  I then made a counter incision 1 fingerbreadth posterior to this lateral incision and then bluntly dissected down to the retroperitoneal fascia.  I then used a curved Kelly to pierce through this fascia and then used my finger to dissect the retroperitoneal space.  I then brought my finger superiorly as though there was on the undersurface of the oblique fascia and then incised the oblique fascia and then bluntly dissected through until I could see my finger.  I then advanced the first probe down to the surface of the iliopsoas muscle.  I then stemmed the surface of  the muscle to ensure that I was not directly endangering the lumbar plexus.  I then advanced the trocar through the psoas down to the lateral aspect of the disk space.  I made sure I was in a slightly in the anterior half of the disk space.  I then circumferentially stimulated and there was no adverse response to suggest trauma to the plexus.  I then sequentially dilated and with each one, I stimulated and then placed a final retracting device.  At no point, was there any adverse electrodiagnostic signals.  I then properly positioned the retractor and secured to the disk space with  the trocar.  Intraoperative fluoro demonstrated satisfactory position.  At this point, I then proceeded with the diskectomy.  An annulotomy was performed, and then using pituitary rongeurs and curettes, I removed the bulk of the disk material.  I then used angled Cobb elevators to remove the remaining portion of the disk and released the anulus on the contralateral side.  Once I had done this, I then trialed with sequential sized spacers from size 6 up to size 10.  The size 10 parallel provided the best fit and allowed for indirect reduction of the foramen and was firmly secured in the disk space.  At this point, I elected to use this.  I rasped the endplates, so I had bleeding subchondral bone, and then inserted the cage.  I got good fixation, it was slightly anterior, but it was still within the intervertebral space.  There was excellent indirect reduction of the foramen noted.  Once this was completed, I irrigated the wound copiously with normal saline and obtained hemostasis using bipolar electrocautery.  I then closed both layers of the external internal oblique fascia with interrupted #1 Vicryl sutures, superficial with 2-0 Vicryl sutures, the skin with 3-0 Monocryl.  The secondary incision, I had made was also closed after irrigating in a layered fashion with 2-0 Vicryl sutures, and 3-0 Monocryl.  At this point, I did not think that putting a lateral plate would be feasible and it would also increase my retraction time on the clutches and so I elected to do posterior pedicle screws.  The patient did not have the pedicle screw construct on the left side.  I then with the patient still in the lateral position identified the lateral borders of the 4 and 5 pedicles and made incisions and then advanced the Jamshidi needle percutaneously down to the junction of the transverse process and facet.  I then advanced the Jamshidi needle into the pedicle and into the vertebral body.  I then  placed a guide pin through this.  I repeated this at the L5 level.  At no point was there any adverse response on the EMGs or with neuro monitoring while stemming the Jamshidi.  I then tapped and then placed the appropriate size screw at both levels.  I stemmed both screws and again, there was no abnormal signal.  At this point, I then placed a rod and locked it into position with the top locking nut.  Both nuts were torqued off according to manufacture's standard.  I removed the inserting devices, irrigated these wounds and closed in the standard similar fashion with 2-0 Vicryl suture 3-0 Monocryl.  At the end of the case, all needle and sponge counts were correct.  Steri-Strips were applied.  Dry dressings were applied and the patient was extubated and transferred to the PACU without incident.  There was no adverse intraoperative events.  The  patient did very well.     Dahlia Bailiff, MD     DDB/MEDQ  D:  10/21/2013  T:  10/21/2013  Job:  [id number]

## 2013-10-22 ENCOUNTER — Inpatient Hospital Stay (HOSPITAL_COMMUNITY): Payer: Medicare Other

## 2013-10-22 ENCOUNTER — Encounter (HOSPITAL_COMMUNITY): Payer: Self-pay

## 2013-10-22 DIAGNOSIS — M519 Unspecified thoracic, thoracolumbar and lumbosacral intervertebral disc disorder: Secondary | ICD-10-CM | POA: Diagnosis not present

## 2013-10-22 MED ORDER — METHOCARBAMOL 500 MG PO TABS: 500.0000 mg | ORAL_TABLET | Freq: Four times a day (QID) | ORAL | Status: DC | PRN

## 2013-10-22 MED ORDER — ONDANSETRON 4 MG PO TBDP: 4.0000 mg | ORAL_TABLET | Freq: Three times a day (TID) | ORAL | Status: DC | PRN

## 2013-10-22 MED ORDER — POLYETHYLENE GLYCOL 3350 17 G PO PACK: 17.0000 g | PACK | Freq: Every day | ORAL | Status: DC

## 2013-10-22 MED ORDER — METHOCARBAMOL 500 MG PO TABS: 500.0000 mg | ORAL_TABLET | Freq: Four times a day (QID) | ORAL | Status: DC

## 2013-10-22 NOTE — Progress Notes (Signed)
PT Cancellation Note  Patient Details Name: Nathan Pham MRN: [id number] DOB: Jul 15, 1951   Cancelled Treatment:    Reason Eval/Treat Not Completed: PT screened, no needs identified, will sign off. Spoke with OT who states pt is mobilizing well and is ready for d/c. PT observed pt ambulating in hallway with modified independence. Will sign off, if needs change please reconsult.    Jolyn Lent 10/22/2013, 10:54 AM  Jolyn Lent, PT, DPT Acute Rehabilitation Services Pager: [phone]

## 2013-10-22 NOTE — Progress Notes (Signed)
Subjective: Doing well.  Pain controlled. Wants to go home today.    Objective: Vital signs in last 24 hours: Temp:  [97.6 F (36.4 C)-99 F (37.2 C)] 97.8 F (36.6 C) (04/10 0456) Pulse Rate:  [52-93] 64 (04/10 0456) Resp:  [8-18] 14 (04/10 0456) BP: (114-149)/(68-97) 128/86 mmHg (04/10 0456) SpO2:  [90 %-100 %] 98 % (04/10 0456)  Intake/Output from previous day: 04/09 0701 - 04/10 0700 In: 3990 [P.O.:240; I.V.:3250; IV Piggyback:500] Out: 2850 [Urine:2750; Blood:100] Intake/Output this shift:    No results found for this basename: HGB,  in the last 72 hours No results found for this basename: WBC, RBC, HCT, PLT,  in the last 72 hours No results found for this basename: NA, K, CL, CO2, BUN, CREATININE, GLUCOSE, CALCIUM,  in the last 72 hours No results found for this basename: LABPT, INR,  in the last 72 hours  Exam:  Alert and oriented.  Neurologically intact.    Assessment/Plan: D/c home today if does well with PT this morning.  D/c PCA.     Benjiman Core 10/22/2013, 7:49 AM

## 2013-10-22 NOTE — Progress Notes (Signed)
Patient passing flatus, voiding spontaneously, and has been evaluated by OT/PT with no further needs.He has been provided with discharge instructions and follow up information. He has brace on while up and is aware of back precautions. He is going home at this time with support from son.

## 2013-10-22 NOTE — Progress Notes (Signed)
Occupational Therapy Evaluation and Discharge  Patient Details Name: Nathan Pham MRN: [id number] DOB: Dec 02, 1950 Today's Date: 10/22/2013    History of Present Illness Pt is 63 y.o Male s/p anterior lateral lumbar fusion 1 level for back pain. Per pt, has hx of multiple cervical and lumbar surgeries.    Clinical Impression   PTA pt lived at home and was independent with ADLs and mobility. Pt reports history of multiple cervical and lumbar surgeries and recalled 3/3 back precautions. Education and training provided regarding compensatory techniques for LB ADLs and pt practiced ambulating steps to prepare for d/c home. Pt verbalized correct log roll technique for bed mobility and has no further ADL concerns. Acute OT to sign off. Notified PT that pt with good balance and functional mobility and has no PT needs at this time.     Follow Up Recommendations  No OT follow up;Supervision - Intermittent    Equipment Recommendations  None recommended by OT       Precautions / Restrictions Precautions Precautions: Back Precaution Booklet Issued: Yes (comment) Precaution Comments: Pt recalled 3/3 precautions independently.  Required Braces or Orthoses: Spinal Brace Spinal Brace: Lumbar corset;Applied in sitting position Restrictions Weight Bearing Restrictions: No      Mobility Bed Mobility               General bed mobility comments: Pt in recliner when OT visited, however pt verbalized log roll technique and reports he has no concerns.  Transfers Overall transfer level: Modified independent Equipment used: Rolling walker (2 wheeled)             General transfer comment: Pt able to sit<>stand mod I for extra time needed while maintaining back precautions. Utilized RW once standing to ambulate with improved balance.    Balance Overall balance assessment: Modified Independent (use of RW)                                          ADL Overall ADL's :  Modified independent                                       General ADL Comments: Pt mod I for all ADLs with use of assistive devices and DME at home. Reports that his family may help with LB dressing initially.               Pertinent Vitals/Pain Pt reports no pain.     Hand Dominance Right   Extremity/Trunk Assessment Upper Extremity Assessment Upper Extremity Assessment: Overall WFL for tasks assessed   Lower Extremity Assessment Lower Extremity Assessment: Overall WFL for tasks assessed   Cervical / Trunk Assessment Cervical / Trunk Assessment: Normal   Communication Communication Communication: No difficulties   Cognition Arousal/Alertness: Awake/alert Behavior During Therapy: WFL for tasks assessed/performed Overall Cognitive Status: Within Functional Limits for tasks assessed                                Home Living Family/patient expects to be discharged to:: Private residence Living Arrangements: Alone Available Help at Discharge: Family;Available 24 hours/day (pt's children will be available to assist) Type of Home: House Home Access: Stairs to enter Entrance Stairs-Number of Steps: 1   Home Layout:  Two level;Able to live on main level with bedroom/bathroom     Bathroom Shower/Tub: Tub/shower unit Shower/tub characteristics: Architectural technologist: Standard         Additional Comments: Pt with history of multiple cervical and back surgeries and reports that he has "all kinds of equipment at home."      Prior Functioning/Environment Level of Independence: Independent                                       End of Session Equipment Utilized During Treatment: Gait belt;Rolling walker;Back brace Nurse Communication: Other (comment) (pt ready for D/C from therapy standpoint)  Activity Tolerance: Patient tolerated treatment well Patient left: in chair;with call bell/phone within reach;with family/visitor  present   Time: 1740-8144 OT Time Calculation (min): 20 min Charges:  OT General Charges $OT Visit: 1 Procedure OT Evaluation $Initial OT Evaluation Tier I: 1 Procedure  Nathan Pham [phone] 10/22/2013, 9:05 AM

## 2013-10-22 NOTE — Care Management Note (Signed)
CARE MANAGEMENT NOTE 10/22/2013  Patient:  Nathan Pham, Nathan Pham   Account Number:  [id number]  Date Initiated:  10/22/2013  Documentation initiated by:  Ricki Miller  Subjective/Objective Assessment:   63 yr old male s/p L4-5 anterior lateral fusion.     Action/Plan:   Patient has no home health or DME needs. Has been ambulating independently. Has family support at discharge.   Anticipated DC Date:  10/22/2013   Anticipated DC Plan:  [place]  CM consult      Choice offered to / List presented to:             Status of service:  Completed, signed off Discharge Disposition:  [place]

## 2013-10-25 ENCOUNTER — Encounter (HOSPITAL_COMMUNITY): Payer: Self-pay | Admitting: Orthopedic Surgery

## 2013-11-02 NOTE — Discharge Summary (Signed)
Patient ID: Nathan Pham MRN: [id number] DOB/AGE: Mar 20, 1951 63 y.o.  Admit date: 10/21/2013 Discharge date: 11/02/2013  Admission Diagnoses:  Active Problems:   Back pain   Discharge Diagnoses:  Active Problems:   Back pain  status post Procedure(s): ANTERIOR LATERAL LUMBAR FUSION 1 LEVEL(XLIF) L4-5  Past Medical History  Diagnosis Date  . Heart attack   . Hypertension   . Hyperlipidemia   . Low back pain   . COPD (chronic obstructive pulmonary disease)   . Arthritis     Surgeries: Procedure(s): ANTERIOR LATERAL LUMBAR FUSION 1 LEVEL(XLIF) L4-5 on 10/21/2013   Consultants:  none  Discharged Condition: Improved  Hospital Course: Nathan Pham is an 63 y.o. male who was admitted 10/21/2013 for operative treatment of L4-5 fusion. Patient failed conservative treatments (please see the history and physical for the specifics) and had severe unremitting pain that affects sleep, daily activities and work/hobbies. After pre-op clearance, the patient was taken to the operating room on 10/21/2013 and underwent  Procedure(s): ANTERIOR LATERAL LUMBAR FUSION 1 LEVEL(XLIF) L4-5.    Patient was given perioperative antibiotics:  Anti-infectives   Start     Dose/Rate Route Frequency Ordered Stop   10/21/13 1930  vancomycin (VANCOCIN) IVPB 1000 mg/200 mL premix     1,000 mg 200 mL/hr over 60 Minutes Intravenous Every 12 hours 10/21/13 1357 10/22/13 0733   10/21/13 0720  vancomycin (VANCOCIN) 1 GM/200ML IVPB    Comments:  Rebekah Chesterfield   : cabinet override      10/21/13 0720 10/21/13 0830       Patient was given sequential compression devices and early ambulation to prevent DVT.   Patient benefited maximally from hospital stay and there were no complications. At the time of discharge, the patient was urinating/moving their bowels without difficulty, tolerating a regular diet, pain is controlled with oral pain medications and they have been cleared by PT/OT.   Recent vital  signs: No data found.    Recent laboratory studies: No results found for this basename: WBC, HGB, HCT, PLT, NA, K, CL, CO2, BUN, CREATININE, GLUCOSE, PT, INR, CALCIUM, 2,  in the last 72 hours   Discharge Medications:     Medication List    STOP taking these medications       aspirin 325 MG tablet      TAKE these medications       amitriptyline 25 MG tablet  Commonly known as:  ELAVIL  Take 25-50 mg by mouth at bedtime.     [employer] STOOL SOFTENER 50 MG capsule  Generic drug:  docusate sodium  Take 50 mg by mouth daily.     gabapentin 600 MG tablet  Commonly known as:  NEURONTIN  Take 600 mg by mouth 3 (three) times daily.     methocarbamol 500 MG tablet  Commonly known as:  ROBAXIN  Take 1 tablet (500 mg total) by mouth 4 (four) times daily.     multivitamin capsule  Take 1 capsule by mouth daily.     nitroGLYCERIN 0.4 MG SL tablet  Commonly known as:  NITROSTAT  Place 0.4 mg under the tongue every 5 (five) minutes as needed for chest pain.     ondansetron 4 MG disintegrating tablet  Commonly known as:  ZOFRAN ODT  Take 1 tablet (4 mg total) by mouth every 8 (eight) hours as needed for nausea or vomiting.     OXYCONTIN 30 MG T12a  Generic drug:  OxyCODONE HCl ER  Take 1 tablet by mouth 3 (three) times daily.     Oxycodone HCl 10 MG Tabs  Take 10 mg by mouth 4 (four) times daily as needed.     polyethylene glycol packet  Commonly known as:  MIRALAX / GLYCOLAX  Take 17 g by mouth daily.     pravastatin 80 MG tablet  Commonly known as:  PRAVACHOL  Take 80 mg by mouth daily.     telmisartan 80 MG tablet  Commonly known as:  MICARDIS  Take 40-80 mg by mouth daily.     Tiotropium Bromide Monohydrate 2.5 MCG/ACT Aers  Inhale 2 puffs into the lungs daily. 2 pffs each am     VEGETABLE LAXATIVE PO  Take 1 tablet by mouth daily.        Diagnostic Studies: Dg Lumbar Spine 2-3 Views  10/22/2013   CLINICAL DATA:  Postop lumbar fusion  EXAM: LUMBAR SPINE - 2-3  VIEW  COMPARISON:  Is are prior  FINDINGS: There are 5 non rib-bearing lumbar type vertebral bodies.  Post L4-L5 left-sided paraspinal fusion and L4-L5 intervertebral disc space replacement. There has been interval restoration of the L4-L5 intervertebral disc space height.  Stable sequela of L5-S1 right-sided paraspinal fusion and ACDF of L5-S1.  Grossly unchanged mild (<25%) compression deformities involving the L1, L3 and L4 vertebral bodies. Grossly unchanged mild DDD of L2-L3 and L3-L4 with disc space height loss, endplate irregularity minimal posteriorly directed disc osteophytosis at these locations.  A spinal stimulator device is seen with lead tips terminating within the spinal canal at the T10 vertebral body.  Post cholecystectomy. Atherosclerotic plaque within the abdominal aorta.  IMPRESSION: 1. Post L4-L5 left-sided paraspinal fusion intervertebral disc space replacement without evidence of complication. 2. Post L5-S1 right-sided paraspinal fusion an ACDF without evidence of hardware failure or loosening.   Electronically Signed   By: Sandi Mariscal M.D.   On: 10/22/2013 07:56   Dg Lumbar Spine 2-3 Views  10/21/2013   CLINICAL DATA:  L3-4 XLIF  EXAM: LUMBAR SPINE - 2-3 VIEW; DG C-ARM GT 120 MIN  FLUOROSCOPY TIME:  2 min 42 second  COMPARISON:  DG LUMBAR SPINE 2-3 VIEWS dated 10/11/2013  FINDINGS: Two intraoperative fluoroscopic spot images of the lumbar spine are provided. There has been interval placement of a unilateral left pedicle screws at L3-4 and an interbody spacer.  There is unilateral right posterior fusion at L5-S1. There is anterior lumbar fusion at L5-S1.  IMPRESSION: L4-5 XLIF.   Electronically Signed   By: Kathreen Devoid   On: 10/21/2013 13:34   Dg Lumbar Spine 2-3 Views  10/11/2013   CLINICAL DATA:  Low back pain.  History of lumbar fusion.  EXAM: LUMBAR SPINE - 2-3 VIEW  COMPARISON:  CT, 03/17/2013  FINDINGS: An anterior fusion has been performed with screws and anterior fixation plate  and an intervertebral cage at L5-S1. There has also been a right-sided posterior fusion with L5 and S1 pedicle screws and a single interconnecting rod. The orthopedic hardware is well-seated. There is no evidence of loosening. There is no change in the appearance of the fusion from the prior CT.  There is no fracture. There is a grade 1 retrolisthesis of L4 on L5. There is moderate loss of disc height at this level with a vacuum disc, endplate sclerosis and endplate osteophytes. Loss of disc height is noted at diffuse level due to subsidence of the intervertebral cage, unchanged from the prior study.  No other spondylolisthesis. There is  mild loss of disc height at L2-L3 and L3-L4. Endplate osteophytes are noted at these levels and also at L1-L2.  Bones are demineralized. There is ectasia and calcifications along the abdominal aorta.  IMPRESSION: 1. Status post anterior posterior fusion at L5-S1 as described. Orthopedic hardware is well-seated. The appearance of the fusion is stable from the prior CT. 2. There are degenerative changes most evident at L4-L5 where there is also a retrolisthesis. This is also stable. 3. No fracture or acute finding.   Electronically Signed   By: Lajean Manes M.D.   On: 10/11/2013 09:30   Dg C-arm Gt 120 Min  10/21/2013   CLINICAL DATA:  L3-4 XLIF  EXAM: LUMBAR SPINE - 2-3 VIEW; DG C-ARM GT 120 MIN  FLUOROSCOPY TIME:  2 min 42 second  COMPARISON:  DG LUMBAR SPINE 2-3 VIEWS dated 10/11/2013  FINDINGS: Two intraoperative fluoroscopic spot images of the lumbar spine are provided. There has been interval placement of a unilateral left pedicle screws at L3-4 and an interbody spacer.  There is unilateral right posterior fusion at L5-S1. There is anterior lumbar fusion at L5-S1.  IMPRESSION: L4-5 XLIF.   Electronically Signed   By: Kathreen Devoid   On: 10/21/2013 13:34        Discharge Orders   Future Orders Complete By Expires   Call MD / Call 911  As directed    Constipation  Prevention  As directed    Diet - low sodium heart healthy  As directed    Discharge instructions  As directed    Driving restrictions  As directed    Increase activity slowly as tolerated  As directed    Lifting restrictions  As directed       Follow-up Information   Schedule an appointment as soon as possible for a visit with Dahlia Bailiff, MD. (need return office visit 2 weeks postop)    Specialty:  Orthopedic Surgery   Contact information:   [address] [place] [address] [place] [address] [phone]       Discharge Plan:  discharge to  home     Signed: Benjiman Core for Dr. Melina Schools [facility] [phone] 11/02/2013, 10:57 AM

## 2013-11-02 NOTE — Discharge Summary (Signed)
Agree with above 

## 2013-11-24 DIAGNOSIS — Z79899 Other long term (current) drug therapy: Secondary | ICD-10-CM | POA: Diagnosis not present

## 2013-11-24 DIAGNOSIS — M5137 Other intervertebral disc degeneration, lumbosacral region: Secondary | ICD-10-CM | POA: Diagnosis not present

## 2013-11-24 DIAGNOSIS — M961 Postlaminectomy syndrome, not elsewhere classified: Secondary | ICD-10-CM | POA: Diagnosis not present

## 2013-11-24 DIAGNOSIS — G894 Chronic pain syndrome: Secondary | ICD-10-CM | POA: Diagnosis not present

## 2013-12-03 DIAGNOSIS — Z981 Arthrodesis status: Secondary | ICD-10-CM | POA: Diagnosis not present

## 2013-12-22 DIAGNOSIS — M461 Sacroiliitis, not elsewhere classified: Secondary | ICD-10-CM | POA: Diagnosis not present

## 2013-12-24 DIAGNOSIS — Z23 Encounter for immunization: Secondary | ICD-10-CM | POA: Diagnosis not present

## 2013-12-24 DIAGNOSIS — I10 Essential (primary) hypertension: Secondary | ICD-10-CM | POA: Diagnosis not present

## 2013-12-24 DIAGNOSIS — E7800 Pure hypercholesterolemia, unspecified: Secondary | ICD-10-CM | POA: Diagnosis not present

## 2014-01-05 DIAGNOSIS — Z79899 Other long term (current) drug therapy: Secondary | ICD-10-CM | POA: Diagnosis not present

## 2014-01-05 DIAGNOSIS — M5137 Other intervertebral disc degeneration, lumbosacral region: Secondary | ICD-10-CM | POA: Diagnosis not present

## 2014-01-05 DIAGNOSIS — G894 Chronic pain syndrome: Secondary | ICD-10-CM | POA: Diagnosis not present

## 2014-01-05 DIAGNOSIS — M1990 Unspecified osteoarthritis, unspecified site: Secondary | ICD-10-CM | POA: Diagnosis not present

## 2014-01-17 DIAGNOSIS — Z981 Arthrodesis status: Secondary | ICD-10-CM | POA: Diagnosis not present

## 2014-02-22 DIAGNOSIS — R5383 Other fatigue: Secondary | ICD-10-CM | POA: Diagnosis not present

## 2014-02-22 DIAGNOSIS — F17200 Nicotine dependence, unspecified, uncomplicated: Secondary | ICD-10-CM | POA: Diagnosis not present

## 2014-02-22 DIAGNOSIS — R5381 Other malaise: Secondary | ICD-10-CM | POA: Diagnosis not present

## 2014-03-02 DIAGNOSIS — M5137 Other intervertebral disc degeneration, lumbosacral region: Secondary | ICD-10-CM | POA: Diagnosis not present

## 2014-03-02 DIAGNOSIS — M1990 Unspecified osteoarthritis, unspecified site: Secondary | ICD-10-CM | POA: Diagnosis not present

## 2014-03-02 DIAGNOSIS — Z79899 Other long term (current) drug therapy: Secondary | ICD-10-CM | POA: Diagnosis not present

## 2014-03-02 DIAGNOSIS — G894 Chronic pain syndrome: Secondary | ICD-10-CM | POA: Diagnosis not present

## 2014-03-30 DIAGNOSIS — M5137 Other intervertebral disc degeneration, lumbosacral region: Secondary | ICD-10-CM | POA: Diagnosis not present

## 2014-03-30 DIAGNOSIS — Z79899 Other long term (current) drug therapy: Secondary | ICD-10-CM | POA: Diagnosis not present

## 2014-03-30 DIAGNOSIS — M1990 Unspecified osteoarthritis, unspecified site: Secondary | ICD-10-CM | POA: Diagnosis not present

## 2014-03-30 DIAGNOSIS — G894 Chronic pain syndrome: Secondary | ICD-10-CM | POA: Diagnosis not present

## 2014-03-31 DIAGNOSIS — I2510 Atherosclerotic heart disease of native coronary artery without angina pectoris: Secondary | ICD-10-CM | POA: Diagnosis not present

## 2014-03-31 DIAGNOSIS — I10 Essential (primary) hypertension: Secondary | ICD-10-CM | POA: Diagnosis not present

## 2014-03-31 DIAGNOSIS — E785 Hyperlipidemia, unspecified: Secondary | ICD-10-CM | POA: Diagnosis not present

## 2014-03-31 DIAGNOSIS — F17200 Nicotine dependence, unspecified, uncomplicated: Secondary | ICD-10-CM | POA: Diagnosis not present

## 2014-04-18 DIAGNOSIS — M25571 Pain in right ankle and joints of right foot: Secondary | ICD-10-CM | POA: Diagnosis not present

## 2014-04-18 DIAGNOSIS — Z981 Arthrodesis status: Secondary | ICD-10-CM | POA: Diagnosis not present

## 2014-05-25 DIAGNOSIS — G894 Chronic pain syndrome: Secondary | ICD-10-CM | POA: Diagnosis not present

## 2014-05-25 DIAGNOSIS — Z79891 Long term (current) use of opiate analgesic: Secondary | ICD-10-CM | POA: Diagnosis not present

## 2014-05-25 DIAGNOSIS — M5137 Other intervertebral disc degeneration, lumbosacral region: Secondary | ICD-10-CM | POA: Diagnosis not present

## 2014-05-25 DIAGNOSIS — M47817 Spondylosis without myelopathy or radiculopathy, lumbosacral region: Secondary | ICD-10-CM | POA: Diagnosis not present

## 2014-05-25 DIAGNOSIS — G609 Hereditary and idiopathic neuropathy, unspecified: Secondary | ICD-10-CM | POA: Diagnosis not present

## 2014-05-25 DIAGNOSIS — M5417 Radiculopathy, lumbosacral region: Secondary | ICD-10-CM | POA: Diagnosis not present

## 2014-05-25 DIAGNOSIS — Z79899 Other long term (current) drug therapy: Secondary | ICD-10-CM | POA: Diagnosis not present

## 2014-05-25 DIAGNOSIS — M791 Myalgia: Secondary | ICD-10-CM | POA: Diagnosis not present

## 2014-05-25 DIAGNOSIS — G5790 Unspecified mononeuropathy of unspecified lower limb: Secondary | ICD-10-CM | POA: Diagnosis not present

## 2014-05-25 DIAGNOSIS — M1990 Unspecified osteoarthritis, unspecified site: Secondary | ICD-10-CM | POA: Diagnosis not present

## 2014-05-30 DIAGNOSIS — M79671 Pain in right foot: Secondary | ICD-10-CM | POA: Diagnosis not present

## 2014-06-18 DIAGNOSIS — J449 Chronic obstructive pulmonary disease, unspecified: Secondary | ICD-10-CM | POA: Diagnosis not present

## 2014-06-18 DIAGNOSIS — J0100 Acute maxillary sinusitis, unspecified: Secondary | ICD-10-CM | POA: Diagnosis not present

## 2014-07-05 DIAGNOSIS — Z23 Encounter for immunization: Secondary | ICD-10-CM | POA: Diagnosis not present

## 2014-07-15 HISTORY — PX: COLONOSCOPY: SHX174

## 2014-07-20 DIAGNOSIS — G894 Chronic pain syndrome: Secondary | ICD-10-CM | POA: Diagnosis not present

## 2014-07-20 DIAGNOSIS — Z79899 Other long term (current) drug therapy: Secondary | ICD-10-CM | POA: Diagnosis not present

## 2014-08-04 DIAGNOSIS — J329 Chronic sinusitis, unspecified: Secondary | ICD-10-CM | POA: Diagnosis not present

## 2014-08-17 DIAGNOSIS — G894 Chronic pain syndrome: Secondary | ICD-10-CM | POA: Diagnosis not present

## 2014-08-17 DIAGNOSIS — Z79899 Other long term (current) drug therapy: Secondary | ICD-10-CM | POA: Diagnosis not present

## 2014-08-25 DIAGNOSIS — Z720 Tobacco use: Secondary | ICD-10-CM | POA: Diagnosis not present

## 2014-08-25 DIAGNOSIS — K4090 Unilateral inguinal hernia, without obstruction or gangrene, not specified as recurrent: Secondary | ICD-10-CM | POA: Diagnosis not present

## 2014-09-05 DIAGNOSIS — R1032 Left lower quadrant pain: Secondary | ICD-10-CM | POA: Diagnosis not present

## 2014-09-05 DIAGNOSIS — K4020 Bilateral inguinal hernia, without obstruction or gangrene, not specified as recurrent: Secondary | ICD-10-CM | POA: Diagnosis not present

## 2014-09-05 DIAGNOSIS — Z1211 Encounter for screening for malignant neoplasm of colon: Secondary | ICD-10-CM | POA: Diagnosis not present

## 2014-09-05 DIAGNOSIS — F17210 Nicotine dependence, cigarettes, uncomplicated: Secondary | ICD-10-CM | POA: Diagnosis not present

## 2014-09-07 DIAGNOSIS — R1032 Left lower quadrant pain: Secondary | ICD-10-CM | POA: Diagnosis not present

## 2014-09-14 DIAGNOSIS — Z79899 Other long term (current) drug therapy: Secondary | ICD-10-CM | POA: Diagnosis not present

## 2014-09-14 DIAGNOSIS — G894 Chronic pain syndrome: Secondary | ICD-10-CM | POA: Diagnosis not present

## 2014-09-15 DIAGNOSIS — I2510 Atherosclerotic heart disease of native coronary artery without angina pectoris: Secondary | ICD-10-CM | POA: Diagnosis not present

## 2014-09-15 DIAGNOSIS — Z01810 Encounter for preprocedural cardiovascular examination: Secondary | ICD-10-CM | POA: Diagnosis not present

## 2014-09-20 DIAGNOSIS — I2510 Atherosclerotic heart disease of native coronary artery without angina pectoris: Secondary | ICD-10-CM | POA: Diagnosis not present

## 2014-09-20 DIAGNOSIS — Z01810 Encounter for preprocedural cardiovascular examination: Secondary | ICD-10-CM | POA: Diagnosis not present

## 2014-10-11 DIAGNOSIS — K4020 Bilateral inguinal hernia, without obstruction or gangrene, not specified as recurrent: Secondary | ICD-10-CM | POA: Diagnosis not present

## 2014-10-12 DIAGNOSIS — I444 Left anterior fascicular block: Secondary | ICD-10-CM | POA: Diagnosis not present

## 2014-10-12 DIAGNOSIS — I10 Essential (primary) hypertension: Secondary | ICD-10-CM | POA: Diagnosis not present

## 2014-10-12 DIAGNOSIS — Z01818 Encounter for other preprocedural examination: Secondary | ICD-10-CM | POA: Diagnosis not present

## 2014-10-12 DIAGNOSIS — R9431 Abnormal electrocardiogram [ECG] [EKG]: Secondary | ICD-10-CM | POA: Diagnosis not present

## 2014-10-14 DIAGNOSIS — E785 Hyperlipidemia, unspecified: Secondary | ICD-10-CM | POA: Diagnosis not present

## 2014-10-14 DIAGNOSIS — I10 Essential (primary) hypertension: Secondary | ICD-10-CM | POA: Diagnosis not present

## 2014-10-14 DIAGNOSIS — J449 Chronic obstructive pulmonary disease, unspecified: Secondary | ICD-10-CM | POA: Diagnosis not present

## 2014-10-14 DIAGNOSIS — M549 Dorsalgia, unspecified: Secondary | ICD-10-CM | POA: Diagnosis not present

## 2014-10-14 DIAGNOSIS — G8929 Other chronic pain: Secondary | ICD-10-CM | POA: Diagnosis not present

## 2014-10-14 DIAGNOSIS — K4020 Bilateral inguinal hernia, without obstruction or gangrene, not specified as recurrent: Secondary | ICD-10-CM | POA: Diagnosis not present

## 2014-10-14 DIAGNOSIS — I252 Old myocardial infarction: Secondary | ICD-10-CM | POA: Diagnosis not present

## 2014-10-14 DIAGNOSIS — F17200 Nicotine dependence, unspecified, uncomplicated: Secondary | ICD-10-CM | POA: Diagnosis not present

## 2014-10-14 DIAGNOSIS — Z955 Presence of coronary angioplasty implant and graft: Secondary | ICD-10-CM | POA: Diagnosis not present

## 2014-10-26 DIAGNOSIS — G894 Chronic pain syndrome: Secondary | ICD-10-CM | POA: Diagnosis not present

## 2014-10-26 DIAGNOSIS — Z79899 Other long term (current) drug therapy: Secondary | ICD-10-CM | POA: Diagnosis not present

## 2014-10-26 DIAGNOSIS — M545 Low back pain: Secondary | ICD-10-CM | POA: Diagnosis not present

## 2014-10-27 DIAGNOSIS — M25529 Pain in unspecified elbow: Secondary | ICD-10-CM | POA: Diagnosis not present

## 2014-10-27 DIAGNOSIS — Z0000 Encounter for general adult medical examination without abnormal findings: Secondary | ICD-10-CM | POA: Diagnosis not present

## 2014-10-27 DIAGNOSIS — Z1389 Encounter for screening for other disorder: Secondary | ICD-10-CM | POA: Diagnosis not present

## 2014-11-23 DIAGNOSIS — M5137 Other intervertebral disc degeneration, lumbosacral region: Secondary | ICD-10-CM | POA: Diagnosis not present

## 2014-11-23 DIAGNOSIS — M79606 Pain in leg, unspecified: Secondary | ICD-10-CM | POA: Diagnosis not present

## 2014-11-23 DIAGNOSIS — M47817 Spondylosis without myelopathy or radiculopathy, lumbosacral region: Secondary | ICD-10-CM | POA: Diagnosis not present

## 2014-11-23 DIAGNOSIS — G894 Chronic pain syndrome: Secondary | ICD-10-CM | POA: Diagnosis not present

## 2014-11-23 DIAGNOSIS — M545 Low back pain: Secondary | ICD-10-CM | POA: Diagnosis not present

## 2014-11-23 DIAGNOSIS — Z79899 Other long term (current) drug therapy: Secondary | ICD-10-CM | POA: Diagnosis not present

## 2014-12-21 DIAGNOSIS — Z79899 Other long term (current) drug therapy: Secondary | ICD-10-CM | POA: Diagnosis not present

## 2014-12-21 DIAGNOSIS — M5137 Other intervertebral disc degeneration, lumbosacral region: Secondary | ICD-10-CM | POA: Diagnosis not present

## 2014-12-21 DIAGNOSIS — G894 Chronic pain syndrome: Secondary | ICD-10-CM | POA: Diagnosis not present

## 2014-12-21 DIAGNOSIS — M47817 Spondylosis without myelopathy or radiculopathy, lumbosacral region: Secondary | ICD-10-CM | POA: Diagnosis not present

## 2014-12-21 DIAGNOSIS — M79606 Pain in leg, unspecified: Secondary | ICD-10-CM | POA: Diagnosis not present

## 2014-12-21 DIAGNOSIS — M545 Low back pain: Secondary | ICD-10-CM | POA: Diagnosis not present

## 2014-12-30 DIAGNOSIS — Z4789 Encounter for other orthopedic aftercare: Secondary | ICD-10-CM | POA: Diagnosis not present

## 2015-01-23 DIAGNOSIS — M5137 Other intervertebral disc degeneration, lumbosacral region: Secondary | ICD-10-CM | POA: Diagnosis not present

## 2015-01-23 DIAGNOSIS — M150 Primary generalized (osteo)arthritis: Secondary | ICD-10-CM | POA: Diagnosis not present

## 2015-01-23 DIAGNOSIS — M47817 Spondylosis without myelopathy or radiculopathy, lumbosacral region: Secondary | ICD-10-CM | POA: Diagnosis not present

## 2015-01-23 DIAGNOSIS — Z79899 Other long term (current) drug therapy: Secondary | ICD-10-CM | POA: Diagnosis not present

## 2015-01-23 DIAGNOSIS — G894 Chronic pain syndrome: Secondary | ICD-10-CM | POA: Diagnosis not present

## 2015-01-23 DIAGNOSIS — M545 Low back pain: Secondary | ICD-10-CM | POA: Diagnosis not present

## 2015-02-03 DIAGNOSIS — M940 Chondrocostal junction syndrome [Tietze]: Secondary | ICD-10-CM | POA: Diagnosis not present

## 2015-02-20 DIAGNOSIS — M5137 Other intervertebral disc degeneration, lumbosacral region: Secondary | ICD-10-CM | POA: Diagnosis not present

## 2015-02-20 DIAGNOSIS — Z79899 Other long term (current) drug therapy: Secondary | ICD-10-CM | POA: Diagnosis not present

## 2015-02-20 DIAGNOSIS — G894 Chronic pain syndrome: Secondary | ICD-10-CM | POA: Diagnosis not present

## 2015-02-20 DIAGNOSIS — M150 Primary generalized (osteo)arthritis: Secondary | ICD-10-CM | POA: Diagnosis not present

## 2015-02-20 DIAGNOSIS — M545 Low back pain: Secondary | ICD-10-CM | POA: Diagnosis not present

## 2015-02-28 DIAGNOSIS — Z1211 Encounter for screening for malignant neoplasm of colon: Secondary | ICD-10-CM | POA: Diagnosis not present

## 2015-03-07 DIAGNOSIS — Z125 Encounter for screening for malignant neoplasm of prostate: Secondary | ICD-10-CM | POA: Diagnosis not present

## 2015-03-07 DIAGNOSIS — E780 Pure hypercholesterolemia: Secondary | ICD-10-CM | POA: Diagnosis not present

## 2015-03-07 DIAGNOSIS — Z79899 Other long term (current) drug therapy: Secondary | ICD-10-CM | POA: Diagnosis not present

## 2015-03-09 DIAGNOSIS — I2510 Atherosclerotic heart disease of native coronary artery without angina pectoris: Secondary | ICD-10-CM | POA: Diagnosis not present

## 2015-03-09 DIAGNOSIS — F17210 Nicotine dependence, cigarettes, uncomplicated: Secondary | ICD-10-CM | POA: Diagnosis not present

## 2015-03-09 DIAGNOSIS — Z1211 Encounter for screening for malignant neoplasm of colon: Secondary | ICD-10-CM | POA: Diagnosis not present

## 2015-03-09 DIAGNOSIS — I10 Essential (primary) hypertension: Secondary | ICD-10-CM | POA: Diagnosis not present

## 2015-03-09 DIAGNOSIS — M47816 Spondylosis without myelopathy or radiculopathy, lumbar region: Secondary | ICD-10-CM | POA: Diagnosis not present

## 2015-03-09 DIAGNOSIS — E785 Hyperlipidemia, unspecified: Secondary | ICD-10-CM | POA: Diagnosis not present

## 2015-03-09 DIAGNOSIS — J449 Chronic obstructive pulmonary disease, unspecified: Secondary | ICD-10-CM | POA: Diagnosis not present

## 2015-03-09 DIAGNOSIS — Z9049 Acquired absence of other specified parts of digestive tract: Secondary | ICD-10-CM | POA: Diagnosis not present

## 2015-03-15 DIAGNOSIS — Z1389 Encounter for screening for other disorder: Secondary | ICD-10-CM | POA: Diagnosis not present

## 2015-03-15 DIAGNOSIS — L259 Unspecified contact dermatitis, unspecified cause: Secondary | ICD-10-CM | POA: Diagnosis not present

## 2015-03-15 DIAGNOSIS — Z720 Tobacco use: Secondary | ICD-10-CM | POA: Diagnosis not present

## 2015-03-15 DIAGNOSIS — R0981 Nasal congestion: Secondary | ICD-10-CM | POA: Diagnosis not present

## 2015-03-21 DIAGNOSIS — M5417 Radiculopathy, lumbosacral region: Secondary | ICD-10-CM | POA: Diagnosis not present

## 2015-03-21 DIAGNOSIS — M5137 Other intervertebral disc degeneration, lumbosacral region: Secondary | ICD-10-CM | POA: Diagnosis not present

## 2015-03-21 DIAGNOSIS — Z79899 Other long term (current) drug therapy: Secondary | ICD-10-CM | POA: Diagnosis not present

## 2015-03-21 DIAGNOSIS — G894 Chronic pain syndrome: Secondary | ICD-10-CM | POA: Diagnosis not present

## 2015-04-20 IMAGING — RF DG C-ARM GT 120 MIN
1 series · 2 of 2 positions shown · non-contrast
Comparison: DG LUMBAR SPINE 2-3 VIEWS dated 10/11/2013

CLINICAL DATA: L3-4 XLIF

EXAM:
LUMBAR SPINE - 2-3 VIEW; DG C-ARM GT 120 MIN
FLUOROSCOPY TIME:  2 min 42 second

[Series 1: run · 2 of 2 slices shown]
[im 1/2]
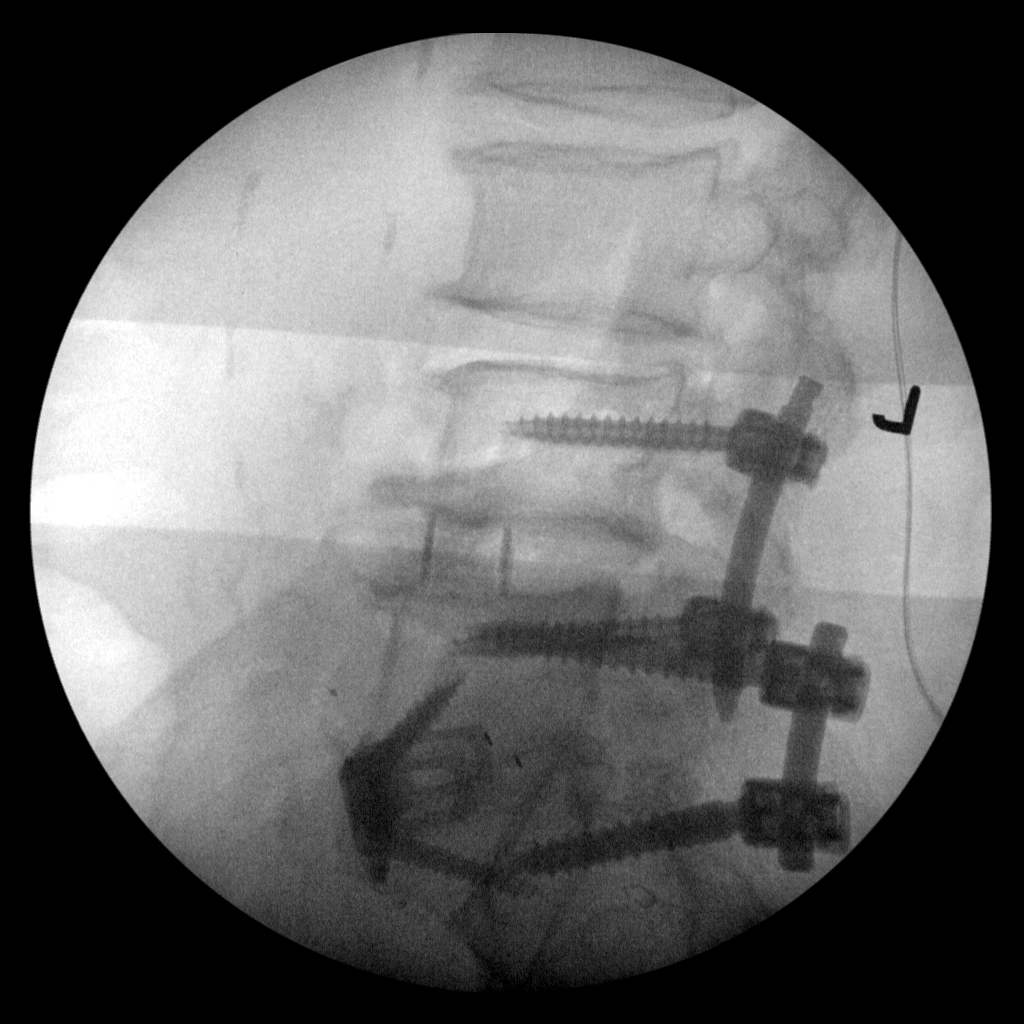
[im 2/2]
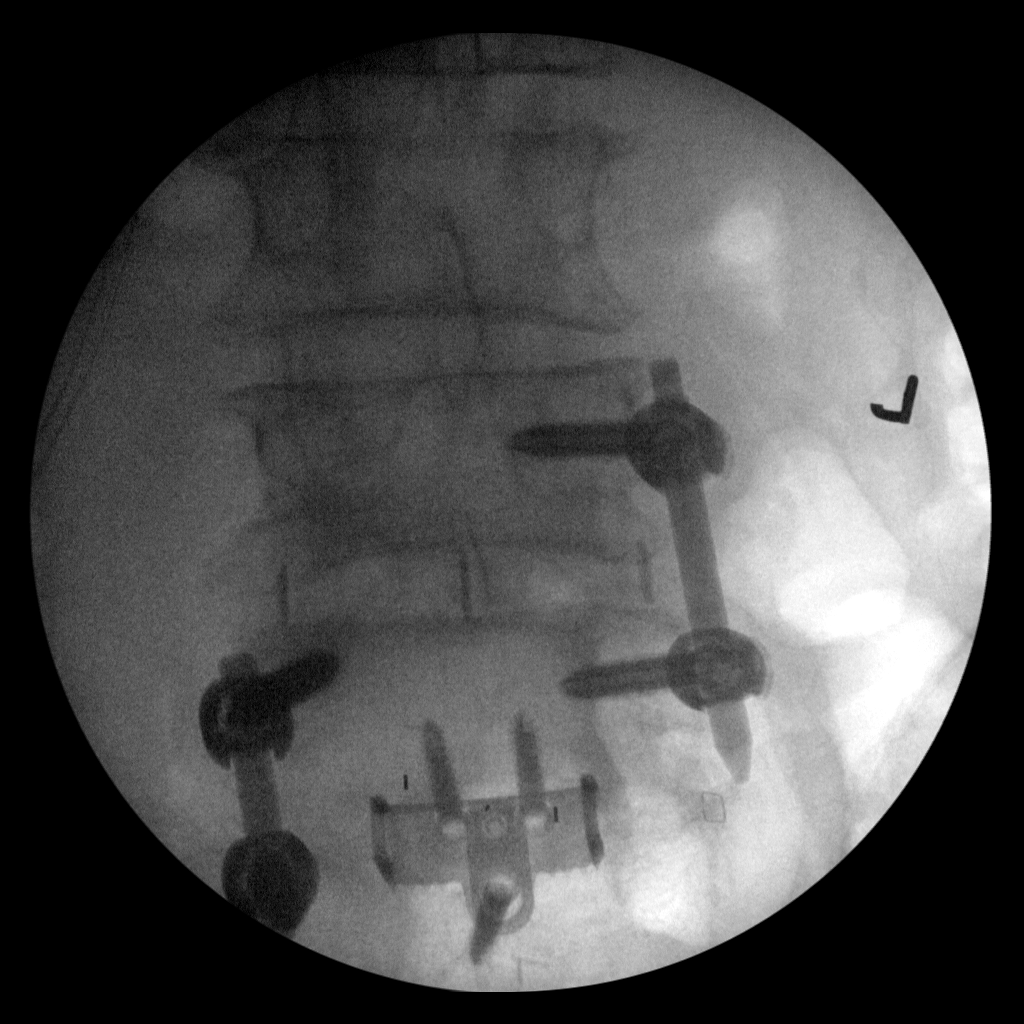

[2 of 2 positions shown; findings below may reference images not displayed]

FINDINGS: Two intraoperative fluoroscopic spot images of the lumbar spine are
provided. There has been interval placement of a unilateral left
pedicle screws at L3-4 and an interbody spacer.

There is unilateral right posterior fusion at L5-S1. There is
anterior lumbar fusion at L5-S1.
IMPRESSION: L4-5 XLIF.

## 2015-04-21 IMAGING — CR DG LUMBAR SPINE 2-3V
2 series · 2 of 2 positions shown · non-contrast
Comparison: Is are prior

CLINICAL DATA: Postop lumbar fusion

EXAM:
LUMBAR SPINE - 2-3 VIEW

[t l-spine a.p. *]
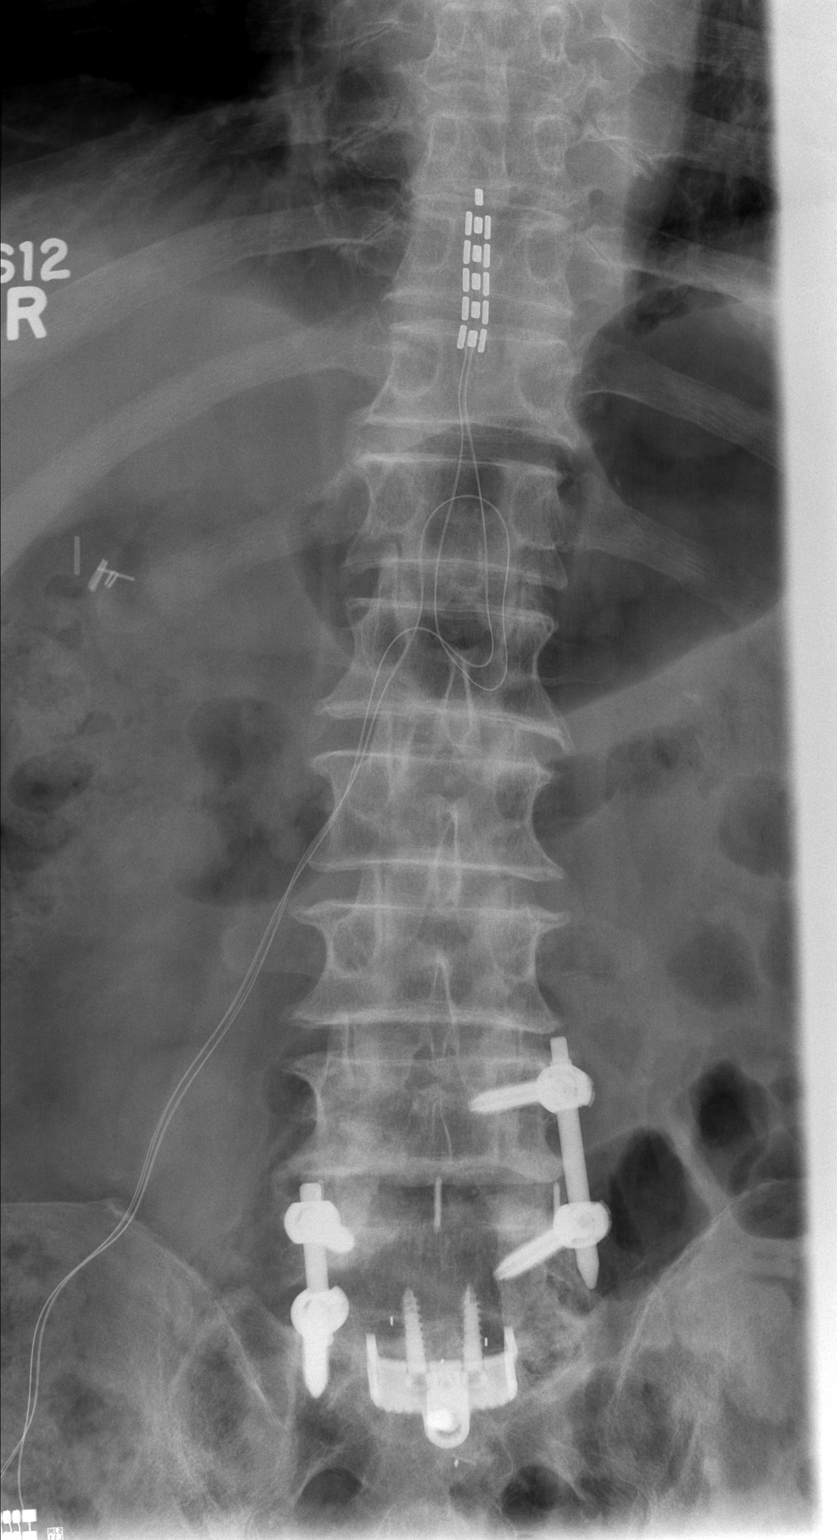

[t l-spine lat]
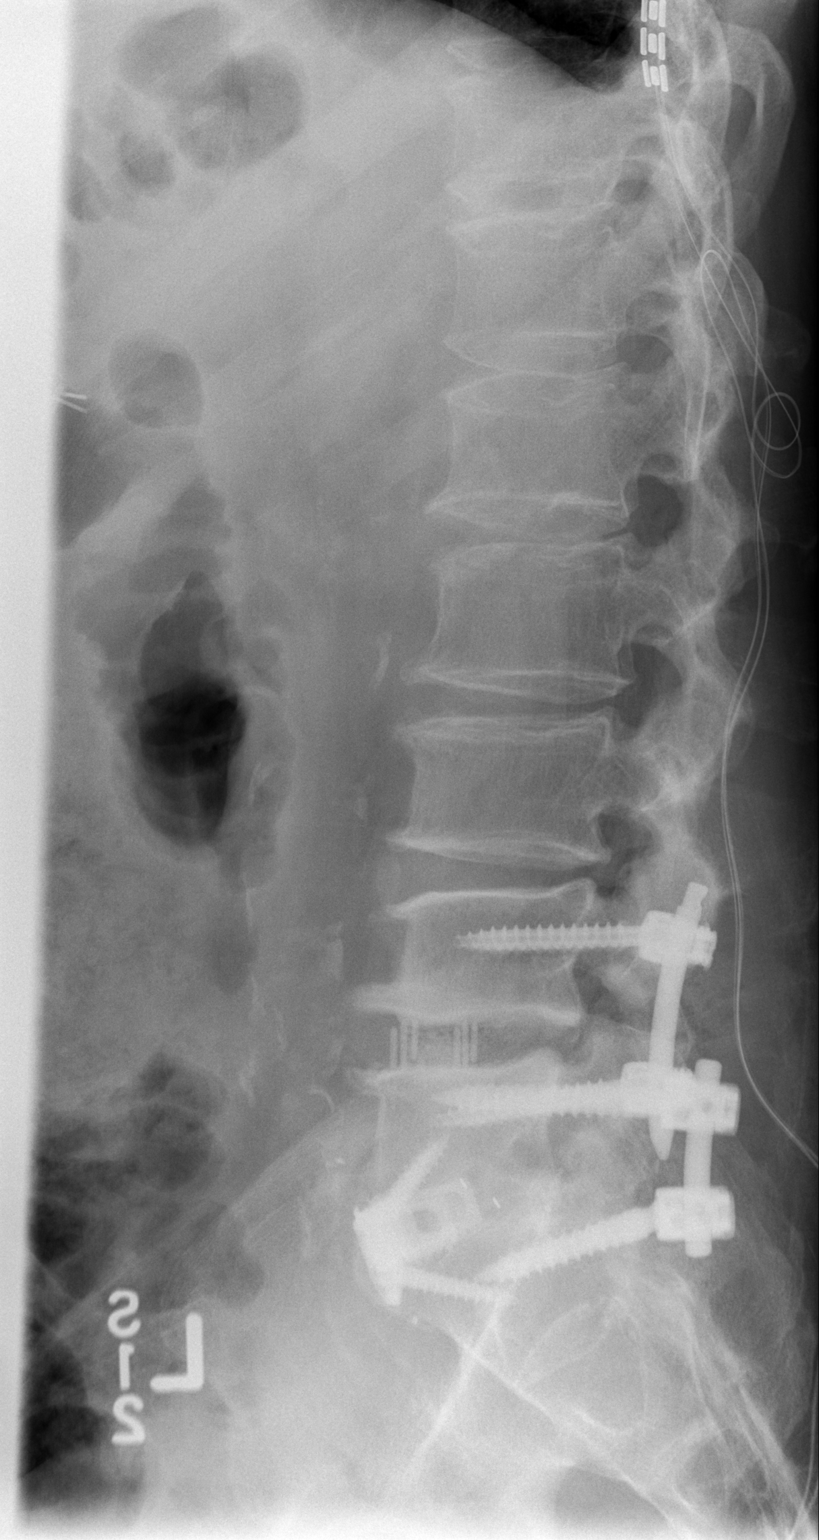

[2 of 2 positions shown; findings below may reference images not displayed]

FINDINGS: There are 5 non rib-bearing lumbar type vertebral bodies.

Post L4-L5 left-sided paraspinal fusion and L4-L5 intervertebral
disc space replacement. There has been interval restoration of the
L4-L5 intervertebral disc space height.

Stable sequela of L5-S1 right-sided paraspinal fusion and ACDF of
L5-S1.

Grossly unchanged mild (<25%) compression deformities involving the
L1, L3 and L4 vertebral bodies. Grossly unchanged mild DDD of L2-L3
and L3-L4 with disc space height loss, endplate irregularity minimal
posteriorly directed disc osteophytosis at these locations.

A spinal stimulator device is seen with lead tips terminating within
the spinal canal at the T10 vertebral body.

Post cholecystectomy. Atherosclerotic plaque within the abdominal
aorta.
IMPRESSION: 1. Post L4-L5 left-sided paraspinal fusion intervertebral disc space
replacement without evidence of complication.
2. Post L5-S1 right-sided paraspinal fusion an ACDF without evidence
of hardware failure or loosening.

## 2015-06-02 DIAGNOSIS — M5417 Radiculopathy, lumbosacral region: Secondary | ICD-10-CM | POA: Diagnosis not present

## 2015-06-02 DIAGNOSIS — G894 Chronic pain syndrome: Secondary | ICD-10-CM | POA: Diagnosis not present

## 2015-06-02 DIAGNOSIS — M47817 Spondylosis without myelopathy or radiculopathy, lumbosacral region: Secondary | ICD-10-CM | POA: Diagnosis not present

## 2015-06-02 DIAGNOSIS — M5137 Other intervertebral disc degeneration, lumbosacral region: Secondary | ICD-10-CM | POA: Diagnosis not present

## 2015-06-02 DIAGNOSIS — Z79899 Other long term (current) drug therapy: Secondary | ICD-10-CM | POA: Diagnosis not present

## 2015-07-05 DIAGNOSIS — I209 Angina pectoris, unspecified: Secondary | ICD-10-CM | POA: Diagnosis not present

## 2015-07-05 DIAGNOSIS — E785 Hyperlipidemia, unspecified: Secondary | ICD-10-CM | POA: Diagnosis not present

## 2015-07-05 DIAGNOSIS — I25118 Atherosclerotic heart disease of native coronary artery with other forms of angina pectoris: Secondary | ICD-10-CM | POA: Diagnosis not present

## 2015-07-05 DIAGNOSIS — F17210 Nicotine dependence, cigarettes, uncomplicated: Secondary | ICD-10-CM | POA: Diagnosis not present

## 2015-07-05 DIAGNOSIS — R079 Chest pain, unspecified: Secondary | ICD-10-CM | POA: Diagnosis not present

## 2015-07-05 DIAGNOSIS — I10 Essential (primary) hypertension: Secondary | ICD-10-CM | POA: Diagnosis not present

## 2015-07-06 DIAGNOSIS — F17210 Nicotine dependence, cigarettes, uncomplicated: Secondary | ICD-10-CM | POA: Diagnosis not present

## 2015-07-06 DIAGNOSIS — I10 Essential (primary) hypertension: Secondary | ICD-10-CM | POA: Diagnosis not present

## 2015-07-06 DIAGNOSIS — E785 Hyperlipidemia, unspecified: Secondary | ICD-10-CM | POA: Diagnosis not present

## 2015-07-06 DIAGNOSIS — I25118 Atherosclerotic heart disease of native coronary artery with other forms of angina pectoris: Secondary | ICD-10-CM | POA: Diagnosis not present

## 2015-07-06 DIAGNOSIS — I209 Angina pectoris, unspecified: Secondary | ICD-10-CM | POA: Diagnosis not present

## 2015-07-06 DIAGNOSIS — R079 Chest pain, unspecified: Secondary | ICD-10-CM | POA: Diagnosis not present

## 2015-07-28 DIAGNOSIS — G894 Chronic pain syndrome: Secondary | ICD-10-CM | POA: Diagnosis not present

## 2015-07-28 DIAGNOSIS — M5137 Other intervertebral disc degeneration, lumbosacral region: Secondary | ICD-10-CM | POA: Diagnosis not present

## 2015-07-28 DIAGNOSIS — Z79899 Other long term (current) drug therapy: Secondary | ICD-10-CM | POA: Diagnosis not present

## 2015-07-28 DIAGNOSIS — M5417 Radiculopathy, lumbosacral region: Secondary | ICD-10-CM | POA: Diagnosis not present

## 2015-07-28 DIAGNOSIS — M47817 Spondylosis without myelopathy or radiculopathy, lumbosacral region: Secondary | ICD-10-CM | POA: Diagnosis not present

## 2015-08-01 DIAGNOSIS — E785 Hyperlipidemia, unspecified: Secondary | ICD-10-CM | POA: Diagnosis not present

## 2015-08-01 DIAGNOSIS — I209 Angina pectoris, unspecified: Secondary | ICD-10-CM | POA: Diagnosis not present

## 2015-08-01 DIAGNOSIS — I10 Essential (primary) hypertension: Secondary | ICD-10-CM | POA: Diagnosis not present

## 2015-08-01 DIAGNOSIS — F17210 Nicotine dependence, cigarettes, uncomplicated: Secondary | ICD-10-CM | POA: Diagnosis not present

## 2015-08-01 DIAGNOSIS — I25118 Atherosclerotic heart disease of native coronary artery with other forms of angina pectoris: Secondary | ICD-10-CM | POA: Diagnosis not present

## 2015-08-17 DIAGNOSIS — G4700 Insomnia, unspecified: Secondary | ICD-10-CM | POA: Diagnosis not present

## 2015-08-17 DIAGNOSIS — Z1389 Encounter for screening for other disorder: Secondary | ICD-10-CM | POA: Diagnosis not present

## 2015-08-17 DIAGNOSIS — I10 Essential (primary) hypertension: Secondary | ICD-10-CM | POA: Diagnosis not present

## 2015-08-17 DIAGNOSIS — I25118 Atherosclerotic heart disease of native coronary artery with other forms of angina pectoris: Secondary | ICD-10-CM | POA: Diagnosis not present

## 2015-08-17 DIAGNOSIS — E785 Hyperlipidemia, unspecified: Secondary | ICD-10-CM | POA: Diagnosis not present

## 2015-08-17 DIAGNOSIS — I209 Angina pectoris, unspecified: Secondary | ICD-10-CM | POA: Diagnosis not present

## 2015-08-17 DIAGNOSIS — F17210 Nicotine dependence, cigarettes, uncomplicated: Secondary | ICD-10-CM | POA: Diagnosis not present

## 2015-08-23 DIAGNOSIS — J449 Chronic obstructive pulmonary disease, unspecified: Secondary | ICD-10-CM | POA: Diagnosis not present

## 2015-08-23 DIAGNOSIS — R5383 Other fatigue: Secondary | ICD-10-CM | POA: Diagnosis not present

## 2015-08-23 DIAGNOSIS — G4733 Obstructive sleep apnea (adult) (pediatric): Secondary | ICD-10-CM | POA: Diagnosis not present

## 2015-08-23 DIAGNOSIS — Z23 Encounter for immunization: Secondary | ICD-10-CM | POA: Diagnosis not present

## 2015-08-23 DIAGNOSIS — F17210 Nicotine dependence, cigarettes, uncomplicated: Secondary | ICD-10-CM | POA: Diagnosis not present

## 2015-08-25 DIAGNOSIS — E559 Vitamin D deficiency, unspecified: Secondary | ICD-10-CM | POA: Diagnosis not present

## 2015-09-09 DIAGNOSIS — G4733 Obstructive sleep apnea (adult) (pediatric): Secondary | ICD-10-CM | POA: Diagnosis not present

## 2015-09-22 DIAGNOSIS — G894 Chronic pain syndrome: Secondary | ICD-10-CM | POA: Diagnosis not present

## 2015-09-22 DIAGNOSIS — M5137 Other intervertebral disc degeneration, lumbosacral region: Secondary | ICD-10-CM | POA: Diagnosis not present

## 2015-09-22 DIAGNOSIS — M47817 Spondylosis without myelopathy or radiculopathy, lumbosacral region: Secondary | ICD-10-CM | POA: Diagnosis not present

## 2015-09-22 DIAGNOSIS — M5417 Radiculopathy, lumbosacral region: Secondary | ICD-10-CM | POA: Diagnosis not present

## 2015-09-22 DIAGNOSIS — Z79899 Other long term (current) drug therapy: Secondary | ICD-10-CM | POA: Diagnosis not present

## 2015-10-09 DIAGNOSIS — F17210 Nicotine dependence, cigarettes, uncomplicated: Secondary | ICD-10-CM | POA: Diagnosis not present

## 2015-10-09 DIAGNOSIS — J449 Chronic obstructive pulmonary disease, unspecified: Secondary | ICD-10-CM | POA: Diagnosis not present

## 2015-10-09 DIAGNOSIS — R5383 Other fatigue: Secondary | ICD-10-CM | POA: Diagnosis not present

## 2015-10-09 DIAGNOSIS — G4733 Obstructive sleep apnea (adult) (pediatric): Secondary | ICD-10-CM | POA: Diagnosis not present

## 2015-10-20 DIAGNOSIS — M47817 Spondylosis without myelopathy or radiculopathy, lumbosacral region: Secondary | ICD-10-CM | POA: Diagnosis not present

## 2015-10-20 DIAGNOSIS — M5417 Radiculopathy, lumbosacral region: Secondary | ICD-10-CM | POA: Diagnosis not present

## 2015-10-20 DIAGNOSIS — Z79899 Other long term (current) drug therapy: Secondary | ICD-10-CM | POA: Diagnosis not present

## 2015-10-20 DIAGNOSIS — G894 Chronic pain syndrome: Secondary | ICD-10-CM | POA: Diagnosis not present

## 2015-10-20 DIAGNOSIS — M5137 Other intervertebral disc degeneration, lumbosacral region: Secondary | ICD-10-CM | POA: Diagnosis not present

## 2015-10-20 DIAGNOSIS — Z79891 Long term (current) use of opiate analgesic: Secondary | ICD-10-CM | POA: Diagnosis not present

## 2015-11-14 DIAGNOSIS — G4733 Obstructive sleep apnea (adult) (pediatric): Secondary | ICD-10-CM | POA: Diagnosis not present

## 2015-11-17 DIAGNOSIS — G4733 Obstructive sleep apnea (adult) (pediatric): Secondary | ICD-10-CM | POA: Diagnosis not present

## 2015-11-17 DIAGNOSIS — Z79891 Long term (current) use of opiate analgesic: Secondary | ICD-10-CM | POA: Diagnosis not present

## 2015-11-17 DIAGNOSIS — M5417 Radiculopathy, lumbosacral region: Secondary | ICD-10-CM | POA: Diagnosis not present

## 2015-11-17 DIAGNOSIS — M5137 Other intervertebral disc degeneration, lumbosacral region: Secondary | ICD-10-CM | POA: Diagnosis not present

## 2015-11-17 DIAGNOSIS — Z79899 Other long term (current) drug therapy: Secondary | ICD-10-CM | POA: Diagnosis not present

## 2015-11-17 DIAGNOSIS — M47817 Spondylosis without myelopathy or radiculopathy, lumbosacral region: Secondary | ICD-10-CM | POA: Diagnosis not present

## 2015-11-17 DIAGNOSIS — G894 Chronic pain syndrome: Secondary | ICD-10-CM | POA: Diagnosis not present

## 2015-12-15 DIAGNOSIS — G894 Chronic pain syndrome: Secondary | ICD-10-CM | POA: Diagnosis not present

## 2015-12-15 DIAGNOSIS — M5417 Radiculopathy, lumbosacral region: Secondary | ICD-10-CM | POA: Diagnosis not present

## 2015-12-15 DIAGNOSIS — M47817 Spondylosis without myelopathy or radiculopathy, lumbosacral region: Secondary | ICD-10-CM | POA: Diagnosis not present

## 2015-12-15 DIAGNOSIS — Z79899 Other long term (current) drug therapy: Secondary | ICD-10-CM | POA: Diagnosis not present

## 2015-12-15 DIAGNOSIS — Z79891 Long term (current) use of opiate analgesic: Secondary | ICD-10-CM | POA: Diagnosis not present

## 2015-12-15 DIAGNOSIS — M5137 Other intervertebral disc degeneration, lumbosacral region: Secondary | ICD-10-CM | POA: Diagnosis not present

## 2015-12-18 DIAGNOSIS — G4733 Obstructive sleep apnea (adult) (pediatric): Secondary | ICD-10-CM | POA: Diagnosis not present

## 2016-01-08 DIAGNOSIS — R5383 Other fatigue: Secondary | ICD-10-CM | POA: Diagnosis not present

## 2016-01-08 DIAGNOSIS — F17210 Nicotine dependence, cigarettes, uncomplicated: Secondary | ICD-10-CM | POA: Diagnosis not present

## 2016-01-08 DIAGNOSIS — J449 Chronic obstructive pulmonary disease, unspecified: Secondary | ICD-10-CM | POA: Diagnosis not present

## 2016-01-08 DIAGNOSIS — G4733 Obstructive sleep apnea (adult) (pediatric): Secondary | ICD-10-CM | POA: Diagnosis not present

## 2016-01-12 DIAGNOSIS — Z79891 Long term (current) use of opiate analgesic: Secondary | ICD-10-CM | POA: Diagnosis not present

## 2016-01-12 DIAGNOSIS — M5417 Radiculopathy, lumbosacral region: Secondary | ICD-10-CM | POA: Diagnosis not present

## 2016-01-12 DIAGNOSIS — M5137 Other intervertebral disc degeneration, lumbosacral region: Secondary | ICD-10-CM | POA: Diagnosis not present

## 2016-01-12 DIAGNOSIS — Z79899 Other long term (current) drug therapy: Secondary | ICD-10-CM | POA: Diagnosis not present

## 2016-01-12 DIAGNOSIS — G894 Chronic pain syndrome: Secondary | ICD-10-CM | POA: Diagnosis not present

## 2016-01-12 DIAGNOSIS — M47817 Spondylosis without myelopathy or radiculopathy, lumbosacral region: Secondary | ICD-10-CM | POA: Diagnosis not present

## 2016-01-17 DIAGNOSIS — G4733 Obstructive sleep apnea (adult) (pediatric): Secondary | ICD-10-CM | POA: Diagnosis not present

## 2016-02-01 DIAGNOSIS — E785 Hyperlipidemia, unspecified: Secondary | ICD-10-CM | POA: Diagnosis not present

## 2016-02-01 DIAGNOSIS — I10 Essential (primary) hypertension: Secondary | ICD-10-CM | POA: Diagnosis not present

## 2016-02-01 DIAGNOSIS — I25118 Atherosclerotic heart disease of native coronary artery with other forms of angina pectoris: Secondary | ICD-10-CM | POA: Diagnosis not present

## 2016-02-02 DIAGNOSIS — E785 Hyperlipidemia, unspecified: Secondary | ICD-10-CM | POA: Diagnosis not present

## 2016-02-02 DIAGNOSIS — I25118 Atherosclerotic heart disease of native coronary artery with other forms of angina pectoris: Secondary | ICD-10-CM | POA: Diagnosis not present

## 2016-02-02 DIAGNOSIS — I10 Essential (primary) hypertension: Secondary | ICD-10-CM | POA: Diagnosis not present

## 2016-02-15 DIAGNOSIS — M79673 Pain in unspecified foot: Secondary | ICD-10-CM | POA: Diagnosis not present

## 2016-02-15 DIAGNOSIS — M961 Postlaminectomy syndrome, not elsewhere classified: Secondary | ICD-10-CM | POA: Diagnosis not present

## 2016-02-15 DIAGNOSIS — M545 Low back pain: Secondary | ICD-10-CM | POA: Diagnosis not present

## 2016-02-15 DIAGNOSIS — G894 Chronic pain syndrome: Secondary | ICD-10-CM | POA: Diagnosis not present

## 2016-02-17 DIAGNOSIS — G4733 Obstructive sleep apnea (adult) (pediatric): Secondary | ICD-10-CM | POA: Diagnosis not present

## 2016-03-14 DIAGNOSIS — M545 Low back pain: Secondary | ICD-10-CM | POA: Diagnosis not present

## 2016-03-14 DIAGNOSIS — G894 Chronic pain syndrome: Secondary | ICD-10-CM | POA: Diagnosis not present

## 2016-03-14 DIAGNOSIS — Z79899 Other long term (current) drug therapy: Secondary | ICD-10-CM | POA: Diagnosis not present

## 2016-03-14 DIAGNOSIS — Z79891 Long term (current) use of opiate analgesic: Secondary | ICD-10-CM | POA: Diagnosis not present

## 2016-03-19 DIAGNOSIS — G4733 Obstructive sleep apnea (adult) (pediatric): Secondary | ICD-10-CM | POA: Diagnosis not present

## 2016-04-10 DIAGNOSIS — Z462 Encounter for fitting and adjustment of other devices related to nervous system and special senses: Secondary | ICD-10-CM | POA: Diagnosis not present

## 2016-04-10 DIAGNOSIS — G894 Chronic pain syndrome: Secondary | ICD-10-CM | POA: Diagnosis not present

## 2016-04-10 DIAGNOSIS — J449 Chronic obstructive pulmonary disease, unspecified: Secondary | ICD-10-CM | POA: Diagnosis not present

## 2016-04-10 DIAGNOSIS — Z9049 Acquired absence of other specified parts of digestive tract: Secondary | ICD-10-CM | POA: Diagnosis not present

## 2016-04-10 DIAGNOSIS — Z955 Presence of coronary angioplasty implant and graft: Secondary | ICD-10-CM | POA: Diagnosis not present

## 2016-04-10 DIAGNOSIS — Z7982 Long term (current) use of aspirin: Secondary | ICD-10-CM | POA: Diagnosis not present

## 2016-04-10 DIAGNOSIS — Z79899 Other long term (current) drug therapy: Secondary | ICD-10-CM | POA: Diagnosis not present

## 2016-04-10 DIAGNOSIS — G4730 Sleep apnea, unspecified: Secondary | ICD-10-CM | POA: Diagnosis not present

## 2016-04-10 DIAGNOSIS — M545 Low back pain: Secondary | ICD-10-CM | POA: Diagnosis not present

## 2016-04-10 DIAGNOSIS — I10 Essential (primary) hypertension: Secondary | ICD-10-CM | POA: Diagnosis not present

## 2016-04-10 DIAGNOSIS — K219 Gastro-esophageal reflux disease without esophagitis: Secondary | ICD-10-CM | POA: Diagnosis not present

## 2016-04-10 DIAGNOSIS — T85193A Other mechanical complication of implanted electronic neurostimulator, generator, initial encounter: Secondary | ICD-10-CM | POA: Diagnosis not present

## 2016-04-10 DIAGNOSIS — I252 Old myocardial infarction: Secondary | ICD-10-CM | POA: Diagnosis not present

## 2016-04-10 DIAGNOSIS — M961 Postlaminectomy syndrome, not elsewhere classified: Secondary | ICD-10-CM | POA: Diagnosis not present

## 2016-04-10 HISTORY — PX: SPINAL CORD STIMULATOR REMOVAL: SHX2423

## 2016-04-18 DIAGNOSIS — G4733 Obstructive sleep apnea (adult) (pediatric): Secondary | ICD-10-CM | POA: Diagnosis not present

## 2016-04-25 DIAGNOSIS — M961 Postlaminectomy syndrome, not elsewhere classified: Secondary | ICD-10-CM | POA: Diagnosis not present

## 2016-04-25 DIAGNOSIS — M545 Low back pain: Secondary | ICD-10-CM | POA: Diagnosis not present

## 2016-04-25 DIAGNOSIS — G894 Chronic pain syndrome: Secondary | ICD-10-CM | POA: Diagnosis not present

## 2016-04-25 DIAGNOSIS — M79673 Pain in unspecified foot: Secondary | ICD-10-CM | POA: Diagnosis not present

## 2016-05-19 DIAGNOSIS — G4733 Obstructive sleep apnea (adult) (pediatric): Secondary | ICD-10-CM | POA: Diagnosis not present

## 2016-05-20 DIAGNOSIS — M545 Low back pain: Secondary | ICD-10-CM | POA: Diagnosis not present

## 2016-05-20 DIAGNOSIS — G894 Chronic pain syndrome: Secondary | ICD-10-CM | POA: Diagnosis not present

## 2016-05-20 DIAGNOSIS — Z79891 Long term (current) use of opiate analgesic: Secondary | ICD-10-CM | POA: Diagnosis not present

## 2016-05-20 DIAGNOSIS — J449 Chronic obstructive pulmonary disease, unspecified: Secondary | ICD-10-CM | POA: Diagnosis not present

## 2016-05-20 DIAGNOSIS — G4733 Obstructive sleep apnea (adult) (pediatric): Secondary | ICD-10-CM | POA: Diagnosis not present

## 2016-05-20 DIAGNOSIS — Z79899 Other long term (current) drug therapy: Secondary | ICD-10-CM | POA: Diagnosis not present

## 2016-05-20 DIAGNOSIS — R5383 Other fatigue: Secondary | ICD-10-CM | POA: Diagnosis not present

## 2016-05-23 DIAGNOSIS — G4733 Obstructive sleep apnea (adult) (pediatric): Secondary | ICD-10-CM | POA: Diagnosis not present

## 2016-05-30 DIAGNOSIS — R5383 Other fatigue: Secondary | ICD-10-CM | POA: Diagnosis not present

## 2016-05-30 DIAGNOSIS — S76211A Strain of adductor muscle, fascia and tendon of right thigh, initial encounter: Secondary | ICD-10-CM | POA: Diagnosis not present

## 2016-05-30 DIAGNOSIS — Z1389 Encounter for screening for other disorder: Secondary | ICD-10-CM | POA: Diagnosis not present

## 2016-05-30 DIAGNOSIS — E7800 Pure hypercholesterolemia, unspecified: Secondary | ICD-10-CM | POA: Diagnosis not present

## 2016-05-30 DIAGNOSIS — Z0000 Encounter for general adult medical examination without abnormal findings: Secondary | ICD-10-CM | POA: Diagnosis not present

## 2016-05-30 DIAGNOSIS — D539 Nutritional anemia, unspecified: Secondary | ICD-10-CM | POA: Diagnosis not present

## 2016-05-30 DIAGNOSIS — Z23 Encounter for immunization: Secondary | ICD-10-CM | POA: Diagnosis not present

## 2016-06-05 DIAGNOSIS — E538 Deficiency of other specified B group vitamins: Secondary | ICD-10-CM | POA: Diagnosis not present

## 2016-06-07 DIAGNOSIS — J432 Centrilobular emphysema: Secondary | ICD-10-CM | POA: Diagnosis not present

## 2016-06-07 DIAGNOSIS — I700 Atherosclerosis of aorta: Secondary | ICD-10-CM | POA: Diagnosis not present

## 2016-06-07 DIAGNOSIS — Z122 Encounter for screening for malignant neoplasm of respiratory organs: Secondary | ICD-10-CM | POA: Diagnosis not present

## 2016-06-07 DIAGNOSIS — Z87891 Personal history of nicotine dependence: Secondary | ICD-10-CM | POA: Diagnosis not present

## 2016-06-17 DIAGNOSIS — Z79891 Long term (current) use of opiate analgesic: Secondary | ICD-10-CM | POA: Diagnosis not present

## 2016-06-17 DIAGNOSIS — Z79899 Other long term (current) drug therapy: Secondary | ICD-10-CM | POA: Diagnosis not present

## 2016-06-17 DIAGNOSIS — M545 Low back pain: Secondary | ICD-10-CM | POA: Diagnosis not present

## 2016-06-17 DIAGNOSIS — G894 Chronic pain syndrome: Secondary | ICD-10-CM | POA: Diagnosis not present

## 2016-06-18 DIAGNOSIS — G4733 Obstructive sleep apnea (adult) (pediatric): Secondary | ICD-10-CM | POA: Diagnosis not present

## 2016-07-17 DIAGNOSIS — Z79899 Other long term (current) drug therapy: Secondary | ICD-10-CM | POA: Diagnosis not present

## 2016-07-17 DIAGNOSIS — Z4542 Encounter for adjustment and management of neuropacemaker (brain) (peripheral nerve) (spinal cord): Secondary | ICD-10-CM | POA: Diagnosis not present

## 2016-07-17 DIAGNOSIS — Z79891 Long term (current) use of opiate analgesic: Secondary | ICD-10-CM | POA: Diagnosis not present

## 2016-07-17 DIAGNOSIS — G894 Chronic pain syndrome: Secondary | ICD-10-CM | POA: Diagnosis not present

## 2016-07-17 DIAGNOSIS — M545 Low back pain: Secondary | ICD-10-CM | POA: Diagnosis not present

## 2016-07-19 DIAGNOSIS — G4733 Obstructive sleep apnea (adult) (pediatric): Secondary | ICD-10-CM | POA: Diagnosis not present

## 2016-08-05 DIAGNOSIS — I25118 Atherosclerotic heart disease of native coronary artery with other forms of angina pectoris: Secondary | ICD-10-CM | POA: Diagnosis not present

## 2016-08-05 DIAGNOSIS — I10 Essential (primary) hypertension: Secondary | ICD-10-CM | POA: Diagnosis not present

## 2016-08-05 DIAGNOSIS — E785 Hyperlipidemia, unspecified: Secondary | ICD-10-CM | POA: Diagnosis not present

## 2016-08-19 DIAGNOSIS — G4733 Obstructive sleep apnea (adult) (pediatric): Secondary | ICD-10-CM | POA: Diagnosis not present

## 2016-08-21 DIAGNOSIS — M5417 Radiculopathy, lumbosacral region: Secondary | ICD-10-CM | POA: Diagnosis not present

## 2016-08-21 DIAGNOSIS — M5137 Other intervertebral disc degeneration, lumbosacral region: Secondary | ICD-10-CM | POA: Diagnosis not present

## 2016-08-21 DIAGNOSIS — G894 Chronic pain syndrome: Secondary | ICD-10-CM | POA: Diagnosis not present

## 2016-08-21 DIAGNOSIS — M47817 Spondylosis without myelopathy or radiculopathy, lumbosacral region: Secondary | ICD-10-CM | POA: Diagnosis not present

## 2016-09-18 DIAGNOSIS — M5417 Radiculopathy, lumbosacral region: Secondary | ICD-10-CM | POA: Diagnosis not present

## 2016-09-18 DIAGNOSIS — G894 Chronic pain syndrome: Secondary | ICD-10-CM | POA: Diagnosis not present

## 2016-09-18 DIAGNOSIS — M5137 Other intervertebral disc degeneration, lumbosacral region: Secondary | ICD-10-CM | POA: Diagnosis not present

## 2016-09-18 DIAGNOSIS — Z79899 Other long term (current) drug therapy: Secondary | ICD-10-CM | POA: Diagnosis not present

## 2016-09-18 DIAGNOSIS — Z79891 Long term (current) use of opiate analgesic: Secondary | ICD-10-CM | POA: Diagnosis not present

## 2016-09-18 DIAGNOSIS — M47817 Spondylosis without myelopathy or radiculopathy, lumbosacral region: Secondary | ICD-10-CM | POA: Diagnosis not present

## 2016-09-23 DIAGNOSIS — R5383 Other fatigue: Secondary | ICD-10-CM | POA: Diagnosis not present

## 2016-09-23 DIAGNOSIS — G4733 Obstructive sleep apnea (adult) (pediatric): Secondary | ICD-10-CM | POA: Diagnosis not present

## 2016-09-23 DIAGNOSIS — J449 Chronic obstructive pulmonary disease, unspecified: Secondary | ICD-10-CM | POA: Diagnosis not present

## 2016-10-17 DIAGNOSIS — G894 Chronic pain syndrome: Secondary | ICD-10-CM | POA: Diagnosis not present

## 2016-10-17 DIAGNOSIS — M545 Low back pain: Secondary | ICD-10-CM | POA: Diagnosis not present

## 2016-10-17 DIAGNOSIS — Z79891 Long term (current) use of opiate analgesic: Secondary | ICD-10-CM | POA: Diagnosis not present

## 2016-10-17 DIAGNOSIS — Z79899 Other long term (current) drug therapy: Secondary | ICD-10-CM | POA: Diagnosis not present

## 2016-11-13 DIAGNOSIS — M25511 Pain in right shoulder: Secondary | ICD-10-CM | POA: Diagnosis not present

## 2016-11-15 DIAGNOSIS — Z79891 Long term (current) use of opiate analgesic: Secondary | ICD-10-CM | POA: Diagnosis not present

## 2016-11-15 DIAGNOSIS — M545 Low back pain: Secondary | ICD-10-CM | POA: Diagnosis not present

## 2016-11-15 DIAGNOSIS — Z79899 Other long term (current) drug therapy: Secondary | ICD-10-CM | POA: Diagnosis not present

## 2016-11-15 DIAGNOSIS — Z4542 Encounter for adjustment and management of neuropacemaker (brain) (peripheral nerve) (spinal cord): Secondary | ICD-10-CM | POA: Diagnosis not present

## 2016-11-15 DIAGNOSIS — M25511 Pain in right shoulder: Secondary | ICD-10-CM | POA: Diagnosis not present

## 2016-11-15 DIAGNOSIS — G894 Chronic pain syndrome: Secondary | ICD-10-CM | POA: Diagnosis not present

## 2016-11-20 DIAGNOSIS — M25511 Pain in right shoulder: Secondary | ICD-10-CM | POA: Diagnosis not present

## 2016-11-21 DIAGNOSIS — E785 Hyperlipidemia, unspecified: Secondary | ICD-10-CM | POA: Diagnosis not present

## 2016-11-21 DIAGNOSIS — Z79899 Other long term (current) drug therapy: Secondary | ICD-10-CM | POA: Diagnosis not present

## 2016-12-12 DIAGNOSIS — I10 Essential (primary) hypertension: Secondary | ICD-10-CM | POA: Diagnosis not present

## 2016-12-13 DIAGNOSIS — G894 Chronic pain syndrome: Secondary | ICD-10-CM | POA: Diagnosis not present

## 2016-12-13 DIAGNOSIS — M545 Low back pain: Secondary | ICD-10-CM | POA: Diagnosis not present

## 2016-12-13 DIAGNOSIS — Z4542 Encounter for adjustment and management of neuropacemaker (brain) (peripheral nerve) (spinal cord): Secondary | ICD-10-CM | POA: Diagnosis not present

## 2016-12-13 DIAGNOSIS — M25511 Pain in right shoulder: Secondary | ICD-10-CM | POA: Diagnosis not present

## 2016-12-14 DIAGNOSIS — M549 Dorsalgia, unspecified: Secondary | ICD-10-CM | POA: Diagnosis not present

## 2016-12-14 DIAGNOSIS — I25110 Atherosclerotic heart disease of native coronary artery with unstable angina pectoris: Secondary | ICD-10-CM | POA: Diagnosis not present

## 2016-12-14 DIAGNOSIS — R072 Precordial pain: Secondary | ICD-10-CM | POA: Diagnosis not present

## 2016-12-14 DIAGNOSIS — I444 Left anterior fascicular block: Secondary | ICD-10-CM | POA: Diagnosis not present

## 2016-12-14 DIAGNOSIS — I200 Unstable angina: Secondary | ICD-10-CM | POA: Diagnosis not present

## 2016-12-14 DIAGNOSIS — R0602 Shortness of breath: Secondary | ICD-10-CM | POA: Diagnosis not present

## 2016-12-14 DIAGNOSIS — R079 Chest pain, unspecified: Secondary | ICD-10-CM | POA: Diagnosis not present

## 2016-12-14 DIAGNOSIS — E785 Hyperlipidemia, unspecified: Secondary | ICD-10-CM | POA: Diagnosis not present

## 2016-12-14 DIAGNOSIS — Z7982 Long term (current) use of aspirin: Secondary | ICD-10-CM | POA: Diagnosis not present

## 2016-12-14 DIAGNOSIS — R0789 Other chest pain: Secondary | ICD-10-CM | POA: Diagnosis not present

## 2016-12-14 DIAGNOSIS — Z9049 Acquired absence of other specified parts of digestive tract: Secondary | ICD-10-CM | POA: Diagnosis not present

## 2016-12-14 DIAGNOSIS — I119 Hypertensive heart disease without heart failure: Secondary | ICD-10-CM | POA: Diagnosis not present

## 2016-12-14 DIAGNOSIS — E784 Other hyperlipidemia: Secondary | ICD-10-CM | POA: Diagnosis not present

## 2016-12-14 DIAGNOSIS — I10 Essential (primary) hypertension: Secondary | ICD-10-CM | POA: Diagnosis not present

## 2016-12-14 DIAGNOSIS — I252 Old myocardial infarction: Secondary | ICD-10-CM | POA: Diagnosis not present

## 2016-12-14 DIAGNOSIS — I4510 Unspecified right bundle-branch block: Secondary | ICD-10-CM | POA: Diagnosis not present

## 2016-12-14 DIAGNOSIS — I2510 Atherosclerotic heart disease of native coronary artery without angina pectoris: Secondary | ICD-10-CM | POA: Diagnosis not present

## 2016-12-23 DIAGNOSIS — R5383 Other fatigue: Secondary | ICD-10-CM | POA: Diagnosis not present

## 2016-12-23 DIAGNOSIS — G4733 Obstructive sleep apnea (adult) (pediatric): Secondary | ICD-10-CM | POA: Diagnosis not present

## 2016-12-23 DIAGNOSIS — J449 Chronic obstructive pulmonary disease, unspecified: Secondary | ICD-10-CM | POA: Diagnosis not present

## 2016-12-24 ENCOUNTER — Inpatient Hospital Stay (HOSPITAL_COMMUNITY)
Admission: EM | Admit: 2016-12-24 | Discharge: 2016-12-26 | DRG: 247 | Disposition: A | Discharge status: Home / Self Care | Payer: Medicare Other | Attending: Cardiovascular Disease | Admitting: Cardiovascular Disease

## 2016-12-24 ENCOUNTER — Encounter (HOSPITAL_COMMUNITY): Payer: Self-pay

## 2016-12-24 ENCOUNTER — Emergency Department (HOSPITAL_COMMUNITY): Payer: Medicare Other

## 2016-12-24 DIAGNOSIS — J449 Chronic obstructive pulmonary disease, unspecified: Secondary | ICD-10-CM | POA: Diagnosis not present

## 2016-12-24 DIAGNOSIS — R079 Chest pain, unspecified: Secondary | ICD-10-CM | POA: Diagnosis not present

## 2016-12-24 DIAGNOSIS — I252 Old myocardial infarction: Secondary | ICD-10-CM

## 2016-12-24 DIAGNOSIS — F17210 Nicotine dependence, cigarettes, uncomplicated: Secondary | ICD-10-CM | POA: Diagnosis present

## 2016-12-24 DIAGNOSIS — I25110 Atherosclerotic heart disease of native coronary artery with unstable angina pectoris: Secondary | ICD-10-CM | POA: Diagnosis present

## 2016-12-24 DIAGNOSIS — L659 Nonscarring hair loss, unspecified: Secondary | ICD-10-CM | POA: Diagnosis present

## 2016-12-24 DIAGNOSIS — M545 Low back pain: Secondary | ICD-10-CM | POA: Diagnosis present

## 2016-12-24 DIAGNOSIS — Z981 Arthrodesis status: Secondary | ICD-10-CM | POA: Diagnosis not present

## 2016-12-24 DIAGNOSIS — I2582 Chronic total occlusion of coronary artery: Secondary | ICD-10-CM | POA: Diagnosis not present

## 2016-12-24 DIAGNOSIS — Z9049 Acquired absence of other specified parts of digestive tract: Secondary | ICD-10-CM

## 2016-12-24 DIAGNOSIS — Z7982 Long term (current) use of aspirin: Secondary | ICD-10-CM

## 2016-12-24 DIAGNOSIS — Y838 Other surgical procedures as the cause of abnormal reaction of the patient, or of later complication, without mention of misadventure at the time of the procedure: Secondary | ICD-10-CM | POA: Diagnosis present

## 2016-12-24 DIAGNOSIS — Z955 Presence of coronary angioplasty implant and graft: Secondary | ICD-10-CM | POA: Diagnosis not present

## 2016-12-24 DIAGNOSIS — R011 Cardiac murmur, unspecified: Secondary | ICD-10-CM | POA: Diagnosis not present

## 2016-12-24 DIAGNOSIS — Z79899 Other long term (current) drug therapy: Secondary | ICD-10-CM | POA: Diagnosis not present

## 2016-12-24 DIAGNOSIS — I2510 Atherosclerotic heart disease of native coronary artery without angina pectoris: Secondary | ICD-10-CM

## 2016-12-24 DIAGNOSIS — I200 Unstable angina: Secondary | ICD-10-CM | POA: Diagnosis not present

## 2016-12-24 DIAGNOSIS — Z9861 Coronary angioplasty status: Secondary | ICD-10-CM

## 2016-12-24 DIAGNOSIS — I452 Bifascicular block: Secondary | ICD-10-CM | POA: Diagnosis not present

## 2016-12-24 DIAGNOSIS — Z716 Tobacco abuse counseling: Secondary | ICD-10-CM

## 2016-12-24 DIAGNOSIS — Z8249 Family history of ischemic heart disease and other diseases of the circulatory system: Secondary | ICD-10-CM | POA: Diagnosis not present

## 2016-12-24 DIAGNOSIS — E782 Mixed hyperlipidemia: Secondary | ICD-10-CM | POA: Diagnosis not present

## 2016-12-24 DIAGNOSIS — Z720 Tobacco use: Secondary | ICD-10-CM | POA: Diagnosis not present

## 2016-12-24 DIAGNOSIS — I10 Essential (primary) hypertension: Secondary | ICD-10-CM | POA: Diagnosis not present

## 2016-12-24 DIAGNOSIS — T82855A Stenosis of coronary artery stent, initial encounter: Secondary | ICD-10-CM | POA: Diagnosis not present

## 2016-12-24 DIAGNOSIS — R0789 Other chest pain: Secondary | ICD-10-CM | POA: Diagnosis not present

## 2016-12-24 HISTORY — DX: Low back pain: M54.5

## 2016-12-24 HISTORY — DX: Atherosclerotic heart disease of native coronary artery without angina pectoris: I25.10

## 2016-12-24 HISTORY — DX: Other chronic pain: G89.29

## 2016-12-24 HISTORY — DX: Low back pain, unspecified: M54.50

## 2016-12-24 HISTORY — DX: Dependence on other enabling machines and devices: Z99.89

## 2016-12-24 HISTORY — DX: Acute myocardial infarction, unspecified: I21.9

## 2016-12-24 HISTORY — DX: Obstructive sleep apnea (adult) (pediatric): G47.33

## 2016-12-24 LAB — LIPID PANEL
CHOLESTEROL: 146 mg/dL (ref 0–200)
HDL: 32 mg/dL — AB (ref >40)
LDL Cholesterol: 59 mg/dL (ref 0–99)
TRIGLYCERIDES: 275 mg/dL — AB (ref <150)
Total CHOL/HDL Ratio: 4.6 RATIO
VLDL: 55 mg/dL — ABNORMAL HIGH (ref 0–40)

## 2016-12-24 LAB — TROPONIN I
TROPONIN I: 0.03 ng/mL — AB (ref <0.03)
TROPONIN I: 0.03 ng/mL — AB (ref <0.03)

## 2016-12-24 LAB — CBC WITH DIFFERENTIAL/PLATELET
BASOS PCT: 0 %
Basophils Absolute: 0 10*3/uL (ref 0.0–0.1)
EOS ABS: 0.3 10*3/uL (ref 0.0–0.7)
EOS PCT: 3 %
HCT: 47.7 % (ref 39.0–52.0)
Hemoglobin: 15.7 g/dL (ref 13.0–17.0)
LYMPHS ABS: 3.4 10*3/uL (ref 0.7–4.0)
Lymphocytes Relative: 32 %
MCH: 30 pg (ref 26.0–34.0)
MCHC: 32.9 g/dL (ref 30.0–36.0)
MCV: 91.2 fL (ref 78.0–100.0)
MONOS PCT: 5 %
Monocytes Absolute: 0.5 10*3/uL (ref 0.1–1.0)
NEUTROS PCT: 60 %
Neutro Abs: 6.5 10*3/uL (ref 1.7–7.7)
PLATELETS: 242 10*3/uL (ref 150–400)
RBC: 5.23 MIL/uL (ref 4.22–5.81)
RDW: 13.6 % (ref 11.5–15.5)
WBC: 10.8 10*3/uL — AB (ref 4.0–10.5)

## 2016-12-24 LAB — COMPREHENSIVE METABOLIC PANEL
ALK PHOS: 87 U/L (ref 38–126)
ALT: 16 U/L — ABNORMAL LOW (ref 17–63)
ANION GAP: 9 (ref 5–15)
AST: 18 U/L (ref 15–41)
Albumin: 4 g/dL (ref 3.5–5.0)
BILIRUBIN TOTAL: 0.3 mg/dL (ref 0.3–1.2)
BUN: 26 mg/dL — ABNORMAL HIGH (ref 6–20)
CALCIUM: 9.5 mg/dL (ref 8.9–10.3)
CO2: 28 mmol/L (ref 22–32)
Chloride: 103 mmol/L (ref 101–111)
Creatinine, Ser: 1.22 mg/dL (ref 0.61–1.24)
GFR calc non Af Amer: >60 mL/min (ref >60)
Glucose, Bld: 75 mg/dL (ref 65–99)
POTASSIUM: 3.9 mmol/L (ref 3.5–5.1)
Sodium: 140 mmol/L (ref 135–145)
TOTAL PROTEIN: 6.8 g/dL (ref 6.5–8.1)

## 2016-12-24 LAB — HEPARIN LEVEL (UNFRACTIONATED): HEPARIN UNFRACTIONATED: 0.47 [IU]/mL (ref 0.30–0.70)

## 2016-12-24 LAB — I-STAT TROPONIN, ED: Troponin i, poc: 0.02 ng/mL (ref 0.00–0.08)

## 2016-12-24 LAB — PROTIME-INR
INR: 0.91
PROTHROMBIN TIME: 12.3 s (ref 11.4–15.2)

## 2016-12-24 LAB — APTT: aPTT: 28 seconds (ref 24–36)

## 2016-12-24 MED ORDER — SODIUM CHLORIDE 0.9 % IV SOLN: 250.0000 mL | INTRAVENOUS | Status: DC | PRN

## 2016-12-24 MED ORDER — LISINOPRIL 40 MG PO TABS
40.0000 mg | ORAL_TABLET | Freq: Every day | ORAL | Status: DC
  Administered 2016-12-24 – 2016-12-26 (×3): 40 mg via ORAL
  Filled 2016-12-24 (×2): qty 1
  Filled 2016-12-24: qty 2
  Filled 2016-12-24: qty 1

## 2016-12-24 MED ORDER — SODIUM CHLORIDE 0.9% FLUSH: 3.0000 mL | INTRAVENOUS | Status: DC | PRN

## 2016-12-24 MED ORDER — NICOTINE 21 MG/24HR TD PT24
21.0000 mg | MEDICATED_PATCH | Freq: Every day | TRANSDERMAL | Status: DC
  Filled 2016-12-24: qty 1

## 2016-12-24 MED ORDER — ASPIRIN 81 MG PO CHEW
324.0000 mg | CHEWABLE_TABLET | Freq: Once | ORAL | Status: AC
  Administered 2016-12-24: 324 mg via ORAL
  Filled 2016-12-24: qty 4

## 2016-12-24 MED ORDER — SODIUM CHLORIDE 0.9% FLUSH: 3.0000 mL | Freq: Two times a day (BID) | INTRAVENOUS | Status: DC

## 2016-12-24 MED ORDER — HEPARIN (PORCINE) IN NACL 100-0.45 UNIT/ML-% IJ SOLN
1100.0000 [IU]/h | INTRAMUSCULAR | Status: DC
  Administered 2016-12-24: 1100 [IU]/h via INTRAVENOUS
  Filled 2016-12-24: qty 250

## 2016-12-24 MED ORDER — PRAVASTATIN SODIUM 40 MG PO TABS
80.0000 mg | ORAL_TABLET | Freq: Every day | ORAL | Status: DC
  Administered 2016-12-24 – 2016-12-26 (×3): 80 mg via ORAL
  Filled 2016-12-24 (×3): qty 2

## 2016-12-24 MED ORDER — METOPROLOL SUCCINATE ER 50 MG PO TB24
50.0000 mg | ORAL_TABLET | Freq: Every day | ORAL | Status: DC
Start: 2016-12-24 — End: 2016-12-26
  Administered 2016-12-24 – 2016-12-26 (×3): 50 mg via ORAL
  Filled 2016-12-24 (×4): qty 1

## 2016-12-24 MED ORDER — HYDROCHLOROTHIAZIDE 25 MG PO TABS
25.0000 mg | ORAL_TABLET | Freq: Every day | ORAL | Status: DC
  Administered 2016-12-24 – 2016-12-26 (×3): 25 mg via ORAL
  Filled 2016-12-24 (×4): qty 1

## 2016-12-24 MED ORDER — ONDANSETRON HCL 4 MG/2ML IJ SOLN: 4.0000 mg | Freq: Four times a day (QID) | INTRAMUSCULAR | Status: DC | PRN

## 2016-12-24 MED ORDER — NITROGLYCERIN 0.4 MG SL SUBL
0.4000 mg | SUBLINGUAL_TABLET | SUBLINGUAL | Status: DC | PRN
  Administered 2016-12-24: 0.4 mg via SUBLINGUAL
  Filled 2016-12-24: qty 1

## 2016-12-24 MED ORDER — ALBUTEROL SULFATE (2.5 MG/3ML) 0.083% IN NEBU
3.0000 mL | INHALATION_SOLUTION | Freq: Four times a day (QID) | RESPIRATORY_TRACT | Status: DC | PRN
  Administered 2016-12-25: 3 mL via RESPIRATORY_TRACT
  Filled 2016-12-24: qty 3

## 2016-12-24 MED ORDER — UMECLIDINIUM-VILANTEROL 62.5-25 MCG/INH IN AEPB
1.0000 | INHALATION_SPRAY | Freq: Every day | RESPIRATORY_TRACT | Status: DC
  Administered 2016-12-25: 08:00:00 1 via RESPIRATORY_TRACT
  Filled 2016-12-24: qty 14

## 2016-12-24 MED ORDER — BACLOFEN 10 MG PO TABS
10.0000 mg | ORAL_TABLET | Freq: Two times a day (BID) | ORAL | Status: DC
  Administered 2016-12-24 – 2016-12-26 (×5): 10 mg via ORAL
  Filled 2016-12-24 (×5): qty 1

## 2016-12-24 MED ORDER — ISOSORBIDE MONONITRATE ER 30 MG PO TB24
30.0000 mg | ORAL_TABLET | Freq: Every day | ORAL | Status: DC
  Administered 2016-12-24 – 2016-12-26 (×3): 30 mg via ORAL
  Filled 2016-12-24 (×3): qty 1

## 2016-12-24 MED ORDER — OXYCODONE HCL 10 MG PO TABS: 10.0000 mg | ORAL_TABLET | Freq: Four times a day (QID) | ORAL | Status: DC | PRN

## 2016-12-24 MED ORDER — ASPIRIN 81 MG PO CHEW
81.0000 mg | CHEWABLE_TABLET | ORAL | Status: AC
  Administered 2016-12-25: 81 mg via ORAL
  Filled 2016-12-24: qty 1

## 2016-12-24 MED ORDER — ASPIRIN EC 81 MG PO TBEC
81.0000 mg | DELAYED_RELEASE_TABLET | Freq: Every day | ORAL | Status: DC
  Administered 2016-12-25 – 2016-12-26 (×2): 81 mg via ORAL
  Filled 2016-12-24 (×2): qty 1

## 2016-12-24 MED ORDER — NITROGLYCERIN 0.4 MG SL SUBL: 0.4000 mg | SUBLINGUAL_TABLET | SUBLINGUAL | Status: DC | PRN

## 2016-12-24 MED ORDER — AMLODIPINE BESYLATE 5 MG PO TABS
5.0000 mg | ORAL_TABLET | Freq: Every day | ORAL | Status: DC
  Administered 2016-12-24 – 2016-12-26 (×3): 5 mg via ORAL
  Filled 2016-12-24 (×3): qty 1

## 2016-12-24 MED ORDER — SODIUM CHLORIDE 0.9 % WEIGHT BASED INFUSION
1.0000 mL/kg/h | INTRAVENOUS | Status: DC
  Administered 2016-12-25: 1 mL/kg/h via INTRAVENOUS

## 2016-12-24 MED ORDER — ACETAMINOPHEN 325 MG PO TABS
650.0000 mg | ORAL_TABLET | ORAL | Status: DC | PRN
Start: 2016-12-24 — End: 2016-12-26
  Administered 2016-12-24: 650 mg via ORAL
  Filled 2016-12-24: qty 2

## 2016-12-24 MED ORDER — SODIUM CHLORIDE 0.9 % WEIGHT BASED INFUSION
3.0000 mL/kg/h | INTRAVENOUS | Status: DC
  Administered 2016-12-25: 3 mL/kg/h via INTRAVENOUS

## 2016-12-24 MED ORDER — SENNA 8.6 MG PO TABS
1.0000 | ORAL_TABLET | Freq: Every day | ORAL | Status: DC
  Administered 2016-12-24 – 2016-12-26 (×3): 8.6 mg via ORAL
  Filled 2016-12-24 (×4): qty 1

## 2016-12-24 MED ORDER — HEPARIN BOLUS VIA INFUSION
4000.0000 [IU] | Freq: Once | INTRAVENOUS | Status: AC
  Administered 2016-12-24: 4000 [IU] via INTRAVENOUS
  Filled 2016-12-24: qty 4000

## 2016-12-24 MED ORDER — OXYCODONE HCL 5 MG PO TABS
10.0000 mg | ORAL_TABLET | Freq: Four times a day (QID) | ORAL | Status: DC | PRN
  Administered 2016-12-24 – 2016-12-26 (×7): 10 mg via ORAL
  Filled 2016-12-24 (×7): qty 2

## 2016-12-24 NOTE — ED Notes (Signed)
Cards Mancel Bale FNP at bedside.

## 2016-12-24 NOTE — ED Triage Notes (Signed)
Pt here for chest pain sts seen at [place] and [place], cath was recommended but pt did not want to stay over night again to wait on cath. Pt reports recent elevated troponin when he signed out ama

## 2016-12-24 NOTE — Progress Notes (Signed)
Patient threatening to leave immediately on admission to [place] [facility]. "Wants to smoke a cigarette and will leave if he can't". Cardiology was paged and said if he goes home, his heart cath is cancelled. Patient was educated about leaving to smoke and threatened to find somewhere to smoke on the unit. Charge nurse was made aware and speaking to him about our rules and policies.

## 2016-12-24 NOTE — ED Notes (Signed)
Report called to [facility] [id number]

## 2016-12-24 NOTE — ED Provider Notes (Signed)
Assumed care from Dr. Morton Amy. Patient is currently awaiting cardiology evaluation for possible admission due to chest pain is concerning for cardiac etiology.  Cardiology to admit   Fatima Blank, MD 12/24/16 [phone]

## 2016-12-24 NOTE — Progress Notes (Signed)
ANTICOAGULATION CONSULT NOTE Pharmacy Consult for heparin Indication: chest pain/ACS  No Known Allergies  Patient Measurements: Height: 5\' 10"  (177.8 cm) Weight: 195 lb (88.5 kg) IBW/kg (Calculated) : 73 Heparin Dosing Weight: 88.5kg  Vital Signs: Temp: 98.2 F (36.8 C) (06/12 2140) Temp Source: Oral (06/12 2140) BP: 98/66 (06/12 2140) Pulse Rate: 65 (06/12 2140)  Labs:  Recent Labs  12/24/16 0147 12/24/16 0550 12/24/16 1546 12/24/16 2119  HGB 15.7  --   --   --   HCT 47.7  --   --   --   PLT 242  --   --   --   APTT 28  --   --   --   LABPROT 12.3  --   --   --   INR 0.91  --   --   --   HEPARINUNFRC  --   --   --  0.47  CREATININE 1.22  --   --   --   TROPONINI 0.03* 0.03* <0.03  --     Estimated Creatinine Clearance: 68.5 mL/min (by C-G formula based on SCr of 1.22 mg/dL).   Assessment: 43 yom presented to the ED with CP. Recently left AMA from OSH with same problem as he didn't want to stay for cardiac cath. To start IV heparin. Baseline CBC and INR are WNL. He is not on anticoagulation PTA.   Heparin level therapeutic this evenings  Goal of Therapy:  Heparin level 0.3-0.7 units/ml Monitor platelets by anticoagulation protocol: Yes   Plan:  Continue heparin at 1100 units / hr Daily heparin level and CBC  Tad Moore 12/24/2016,9:51 PM

## 2016-12-24 NOTE — ED Notes (Signed)
Troponin 0.03 nurse first aware.

## 2016-12-24 NOTE — H&P (Signed)
History & Physical    Patient ID: Nathan Pham MRN: [id number], DOB/AGE: 1950-10-02   Admit date: 12/24/2016   Primary Physician: Nathan Sheriff, MD Primary Cardiologist: Dr. Geraldo Pham  Patient Profile    66 yo male with PMH of CAD s/p RCA ('09), HTN, HL, COPD and tobacco use who presented with chest pain.   Past Medical History   Past Medical History:  Diagnosis Date  . Arthritis   . COPD (chronic obstructive pulmonary disease) ([place])   . Heart attack ([place])   . Hyperlipidemia   . Hypertension   . Low back pain     Past Surgical History:  Procedure Laterality Date  . ANTERIOR LAT LUMBAR FUSION N/A 10/21/2013   Procedure: ANTERIOR LATERAL LUMBAR FUSION 1 LEVEL(XLIF) L4-5;  Surgeon: Nathan Schools, MD;  Location: [facility];  Service: Orthopedics;  Laterality: N/A;  . BACK SURGERY    . CHOLECYSTECTOMY    . CORONARY ANGIOPLASTY WITH STENT PLACEMENT     2009 HPR  . NECK SURGERY     x 4  . SINUS SURGERY WITH INSTATRAK     x 2     Allergies  No Known Allergies  History of Present Illness    Nathan Pham is a 66 yo male with PMH of CAD s/p RCA ('09), HTN, HL, COPD and tobacco use who is followed in [place]. Reports weeks ago on Friday he developed central chest pain with radiation into the left arm while driving. He presented to the ED at [facility] and was seen by Cardiology there who recommended a cardiac cath. He was transferred to [facility] for cath on Sunday but was then told that he would not be able to have cath done until Monday. He signed out AMA from there. States he has continued to have dull chest tightness since that time. Last night around 11pm his symptoms worsened and his left arm began to hurt again.   Presented to the [facility] ED. Labs showed stable electrolytes, Trop 0.03x2, LDL 59, Hgb 15.7. EKG showed SR with RBBB, with no previous to compare. He was given SL nitro in the ED with minimal improvement in symptoms. CXR was negative.   Home Medications    Prior  to Admission medications   Medication Sig Start Date End Date Taking? Authorizing Provider  albuterol (PROVENTIL HFA;VENTOLIN HFA) 108 (90 Base) MCG/ACT inhaler Inhale 2 puffs into the lungs every 6 (six) hours as needed for wheezing or shortness of breath.   Yes [provider]  aspirin 325 MG EC tablet Take 325 mg by mouth daily.   Yes [provider]  baclofen (LIORESAL) 10 MG tablet Take 10 mg by mouth 2 (two) times daily.   Yes [provider]  docusate sodium ([employer] STOOL SOFTENER) 50 MG capsule Take 100 mg by mouth daily.    Yes [provider]  ergocalciferol (VITAMIN D2) 50000 units capsule Take 50,000 Units by mouth once a week. THURSDAY   Yes [provider]  hydrochlorothiazide (HYDRODIURIL) 25 MG tablet Take 25 mg by mouth daily.   Yes [provider]  lisinopril (PRINIVIL,ZESTRIL) 40 MG tablet Take 40 mg by mouth daily.   Yes [provider]  metoprolol succinate (TOPROL-XL) 50 MG 24 hr tablet Take 50 mg by mouth daily. Take with or immediately following a meal.   Yes [provider]  Multiple Vitamin (MULTIVITAMIN) capsule Take 1 capsule by mouth daily.   Yes [provider]  nitroGLYCERIN (NITROSTAT)  0.4 MG SL tablet Place 0.4 mg under the tongue every 5 (five) minutes as needed for chest pain.   Yes [provider]  Oxycodone HCl 10 MG TABS Take 10 mg by mouth 4 (four) times daily as needed (pain).    Yes [provider]  pravastatin (PRAVACHOL) 80 MG tablet Take 80 mg by mouth daily.   Yes [provider]  ranitidine (ZANTAC) 300 MG tablet Take 300 mg by mouth daily.   Yes [provider]  senna (SENOKOT) 8.6 MG TABS tablet Take 1 tablet by mouth daily.   Yes [provider]  umeclidinium-vilanterol (ANORO ELLIPTA) 62.5-25 MCG/INH AEPB Inhale 1 puff into the lungs daily.   Yes [provider]    Family History     Family History  Problem  Relation Age of Onset  . Heart disease Mother     Social History    Social History   Social History  . Marital status: Legally Separated    Spouse name: N/A  . Number of children: N/A  . Years of education: N/A   Occupational History  . Disabled    Social History Main Topics  . Smoking status: Current Every Day Smoker    Packs/day: 0.25    Years: 53.00    Types: Cigarettes  . Smokeless tobacco: Never Used  . Alcohol use No  . Drug use: No  . Sexual activity: Not on file   Other Topics Concern  . Not on file   Social History Narrative  . No narrative on file     Review of Systems    See HPI  All other systems reviewed and are otherwise negative except as noted above.  Physical Exam    Blood pressure (!) 146/88, pulse 63, temperature 98.2 F (36.8 C), temperature source Oral, resp. rate 14, height 5\' 10"  (1.778 m), weight 195 lb (88.5 kg), SpO2 98 %.  General: Older WM, NAD Psych: Normal affect. Neuro: Alert and oriented X 3. Moves all extremities spontaneously. HEENT: Normal  Neck: Supple without bruits or JVD. Lungs:  Resp regular and unlabored, CTA. Heart: RRR no s3, s4, or murmurs. Abdomen: Soft, non-tender, non-distended, BS + x 4.  Extremities: No clubbing, cyanosis or edema. DP/PT/Radials 2+ and equal bilaterally.  Labs    Troponin [facility] of Care Test)  Recent Labs  12/24/16 0157  TROPIPOC 0.02    Recent Labs  12/24/16 0147 12/24/16 0550  TROPONINI 0.03* 0.03*   Lab Results  Component Value Date   WBC 10.8 (H) 12/24/2016   HGB 15.7 12/24/2016   HCT 47.7 12/24/2016   MCV 91.2 12/24/2016   PLT 242 12/24/2016    Recent Labs Lab 12/24/16 0147  NA 140  K 3.9  CL 103  CO2 28  BUN 26*  CREATININE 1.22  CALCIUM 9.5  PROT 6.8  BILITOT 0.3  ALKPHOS 87  ALT 16*  AST 18  GLUCOSE 75   Lab Results  Component Value Date   CHOL 146 12/24/2016   HDL 32 (L) 12/24/2016   LDLCALC 59 12/24/2016   TRIG 275 (H) 12/24/2016   No  results found for: [facility]   Radiology Studies    Dg Chest 2 View  Result Date: 12/24/2016 CLINICAL DATA:  Acute onset of generalized chest pain. Initial encounter. EXAM: CHEST  2 VIEW COMPARISON:  Chest radiograph performed 12/14/2016 FINDINGS: The lungs are well-aerated. Mild peribronchial thickening is noted. There is no evidence of focal opacification, pleural effusion or pneumothorax.  The heart is normal in size; the mediastinal contour is within normal limits. No acute osseous abnormalities are seen. Thoracic spinal stimulation leads are partially imaged. IMPRESSION: Mild peribronchial thickening noted.  Lungs otherwise clear. Electronically Signed   By: Garald Balding M.D.   On: 12/24/2016 04:18    ECG & Cardiac Imaging    EKG: SR with RBBB  Assessment & Plan    66 yo male with PMH of CAD s/p RCA ('09), HTN, HL, COPD and tobacco use who presented with chest pain.   1. Unstable Angina: Reports symptoms began about 2 weeks ago and have lingered since that time. Was seen at [facility] and transferred to [facility], but signed out AMA due to not wanting to wait for cath. Symptoms returned last night, and were worse. In the ED trop 0.03x2. EKG shows RBBB, but previous to compare to. Given SL nitro with minimal improvement in pain. Given reported symptoms, with going chest pain recommend cardiac catheterization.  -- add nitro paste  2. HTN: Elevated in the ED, but had not received morning medications yet.  -- continue metoprolol, ACEi, HCTZ  3. HL: LDL at goal -- continue pravastatin  4. COPD: Stable, mild wheezing on exam  5. Tobacco Use: States he has reduced his tobacco use from 2 packs a day to 2/3 cigarettes a day.   Barnet Pall, NP-C Pager [phone] 12/24/2016, 10:49 AM   Patient seen and examined. Agree with assessment and plan. Mr. Radwan Cowley is a 66 year old Caucasian male who is from [place], [place].  He admits to having tobacco abuse since age 23 when  he started to smoke cigars and up until age 18 was smoking 2-3 packs of cigarettes per day.  Recently he has reduced his tobacco intake to 2-3 cigarettes per day.  He has known CAD and is status post RCA intervention in 2009.  He has a history of hypertension, hyperlipidemia, and COPD. he was recently hospitalized at [facility] and was told of having mild troponin elevation.  He ultimately was referred to [facility] for cardiac catheterization but due to delay in the fact that the procedure could not be done on Sunday and he may have to wait until Monday or Tuesday.  He ultimately signed out [place].  As continue to note some dull chest tightness since that time and has had difficulty with blood pressure elevation.  He develop recurrent symptomatology last evening leading to his presentation to the emergency room this morning.  [place], he states he has a mild residual chest discomfort.  His last blood pressure reading was 175/115, although he states previously it had been 144/88.  He is adamant that he wants to go outside and smoke a cigarette.  He has male pattern alopecia.  He appears older than stated age.  There is no JVD.  He has diffusely decreased breath sounds from long-standing tobacco use.  Rhythm was regular without S3 or S4 gallop.  There was a faint 1/6 systolic murmur.  There was no chest wall tenderness.  Abdomen was soft and nontender.  There was no postvoid megaly.  Distal pulses were adequate.  There was no edema.  Neurologic exam was grossly nonfocal.  His ECG independently reviewed by me shows normal sinus rhythm at 64 bpm, T-wave inversion in lead 3, right bundle-branch block and left anterior hemiblock.  Agree with assessment above.  With his recurrent symptomatology.  I have recommended he undergo repeat cardiac catheterization.  To schedule includes  this being done today and we will try to arrange for tomorrow.  The patient is insistent on having a cigarette and going  outside.  I am not certain if he will stay.  We will let amlodipine to his medical regimen and continue with beta blocker, ACE inhibitor.  I discussed the possibility of IV nitroglycerin, but he states this does not work with him.  Will add isosorbide.  Will tentatively plan catheterization procedure for tomorrow.   Troy Sine, MD, [facility] 12/24/2016 12:55 PM

## 2016-12-24 NOTE — ED Notes (Signed)
Dr Goldston in room.  

## 2016-12-24 NOTE — ED Provider Notes (Addendum)
[facility] DEPT Provider Note   CSN: [id number] Arrival date & time: 12/24/16  0136  By signing my name below, I, Evelene Croon, attest that this documentation has been prepared under the direction and in the presence of Sherwood Gambler, MD . Electronically Signed: Evelene Croon, Scribe. 12/24/2016. 3:47 AM.  History   Chief Complaint Chief Complaint  Patient presents with  . Chest Pain   The history is provided by the patient. No language interpreter was used.    HPI Comments:  Nathan Pham is a 66 y.o. male with a history of HTN. HLD, CAD, MI and coronary stent placement, who presents to the Emergency Department complaining of non-radiating central CP. He states he was seen at [facility] [place]  9 days ago for the same CP. Cardiac catheterization was recommended at the time but the pt did not want to stay overnight to have it done the next day.  Pt reports taking 2 nitro with moderate relief at that time but states the pain had persisted all week. Now at a 4/10 which increased today. No exacerbation of pain with exertion. He denies SOB, nausea, vomiting, leg swelling, and acute back pan. Pt smokes ~ 2-3 cigarettes a day.   Past Medical History:  Diagnosis Date  . Arthritis   . COPD (chronic obstructive pulmonary disease) ([place])   . Heart attack ([place])   . Hyperlipidemia   . Hypertension   . Low back pain     Patient Active Problem List   Diagnosis Date Noted  . Back pain 10/21/2013  . COPD GOLD III 09/21/2013  . HBP (high blood pressure) 09/21/2013  . Smoker 09/21/2013    Past Surgical History:  Procedure Laterality Date  . ANTERIOR LAT LUMBAR FUSION N/A 10/21/2013   Procedure: ANTERIOR LATERAL LUMBAR FUSION 1 LEVEL(XLIF) L4-5;  Surgeon: Melina Schools, MD;  Location: [facility];  Service: Orthopedics;  Laterality: N/A;  . BACK SURGERY    . CHOLECYSTECTOMY    . CORONARY ANGIOPLASTY WITH STENT PLACEMENT     2009 HPR  . NECK SURGERY     x 4  . SINUS  SURGERY WITH INSTATRAK     x 2       Home Medications    Prior to Admission medications   Medication Sig Start Date End Date Taking? Authorizing Provider  albuterol (PROVENTIL HFA;VENTOLIN HFA) 108 (90 Base) MCG/ACT inhaler Inhale 2 puffs into the lungs every 6 (six) hours as needed for wheezing or shortness of breath.   Yes [provider]  aspirin 325 MG EC tablet Take 325 mg by mouth daily.   Yes [provider]  baclofen (LIORESAL) 10 MG tablet Take 10 mg by mouth 2 (two) times daily.   Yes [provider]  docusate sodium ([employer]S STOOL SOFTENER) 50 MG capsule Take 100 mg by mouth daily.    Yes [provider]  ergocalciferol (VITAMIN D2) 50000 units capsule Take 50,000 Units by mouth once a week. THURSDAY   Yes [provider]  hydrochlorothiazide (HYDRODIURIL) 25 MG tablet Take 25 mg by mouth daily.   Yes [provider]  lisinopril (PRINIVIL,ZESTRIL) 40 MG tablet Take 40 mg by mouth daily.   Yes [provider]  metoprolol succinate (TOPROL-XL) 50 MG 24 hr tablet Take 50 mg by mouth daily. Take with or immediately following a meal.   Yes [provider]  Multiple Vitamin (MULTIVITAMIN) capsule Take 1 capsule by mouth daily.   Yes [provider]  nitroGLYCERIN (NITROSTAT) 0.4 MG SL tablet Place 0.4 mg under the tongue every 5 (five) minutes as needed for chest pain.   Yes [provider]  Oxycodone HCl 10 MG TABS Take 10 mg by mouth 4 (four) times daily as needed (pain).    Yes [provider]  pravastatin (PRAVACHOL) 80 MG tablet Take 80 mg by mouth daily.   Yes [provider]  ranitidine (ZANTAC) 300 MG tablet Take 300 mg by mouth daily.   Yes [provider]  senna (SENOKOT) 8.6 MG TABS tablet Take 1 tablet by mouth daily.   Yes [provider]  umeclidinium-vilanterol (ANORO ELLIPTA) 62.5-25 MCG/INH AEPB Inhale 1 puff into the lungs daily.   Yes [provider]    Family History Family History  Problem Relation Age of Onset  . Heart disease Mother     Social History Social History  Substance Use Topics  . Smoking status: Current Every Day Smoker    Packs/day: 0.25    Years: 53.00    Types: Cigarettes  . Smokeless tobacco: Never Used  . Alcohol use No     Allergies   Patient has no known allergies.   Review of Systems Review of Systems  Respiratory: Negative for shortness of breath.   Cardiovascular: Positive for chest pain. Negative for leg swelling.  Gastrointestinal: Negative for nausea and vomiting.  Musculoskeletal: Positive for back pain (chronic).  All other systems reviewed and are negative.    Physical Exam Updated Vital Signs BP (!) 146/88   Pulse 63   Temp 98.2 F (36.8 C) (Oral)   Resp 14   Ht 5\' 10"  (1.778 m)   Wt 88.5 kg (195 lb)   SpO2 98%   BMI 27.98 kg/m   Physical Exam  Constitutional: He is oriented to person, place, and time. He appears well-developed and well-nourished.  HENT:  Head: Normocephalic and atraumatic.  Right Ear: External ear normal.  Left Ear: External ear normal.  Nose: Nose normal.  Eyes: Right eye exhibits no discharge. Left eye exhibits no discharge.  Neck: Neck supple.  Cardiovascular: Normal rate, regular rhythm and normal heart sounds.   Pulses:      Radial pulses are 2+ on the right side, and 2+ on the left side.  Pulmonary/Chest: Effort normal and breath sounds normal.  Abdominal: Soft. There is no tenderness.  Musculoskeletal: He exhibits no edema.  Neurological: He is alert and oriented to person, place, and time.  Skin: Skin is warm and dry.  Nursing note and vitals reviewed.    ED Treatments / Results  DIAGNOSTIC STUDIES:  Oxygen Saturation is 100% on RA, normal by my interpretation.    COORDINATION OF CARE:  3:06 AM Discussed treatment plan with pt at bedside and pt agreed to plan.  Labs (all labs ordered are listed, but only abnormal  results are displayed) Labs Reviewed  CBC WITH DIFFERENTIAL/PLATELET - Abnormal; Notable for the following:       Result Value   WBC 10.8 (*)    All other components within normal limits  COMPREHENSIVE METABOLIC PANEL - Abnormal; Notable for the following:    BUN 26 (*)    ALT 16 (*)    All other components within normal limits  TROPONIN I - Abnormal; Notable for the following:    Troponin I 0.03 (*)    All other components within normal limits  LIPID PANEL - Abnormal; Notable for the following:    Triglycerides 275 (*)  HDL 32 (*)    VLDL 55 (*)    All other components within normal limits  TROPONIN I - Abnormal; Notable for the following:    Troponin I 0.03 (*)    All other components within normal limits  PROTIME-INR  APTT  I-STAT TROPOININ, ED    EKG  EKG Interpretation  Date/Time:  Tuesday December 24 2016 05:35:19 EDT Ventricular Rate:  64 PR Interval:  162 QRS Duration: 140 QT Interval:  400 QTC Calculation: 413 R Axis:   -93 Text Interpretation:  Sinus rhythm RBBB and LAFB Lateral infarct, age indeterminate no significant change compared to earlier in the day Confirmed by Sherwood Gambler [id number]) on 12/24/2016 7:08:30 AM       Radiology Dg Chest 2 View  Result Date: 12/24/2016 CLINICAL DATA:  Acute onset of generalized chest pain. Initial encounter. EXAM: CHEST  2 VIEW COMPARISON:  Chest radiograph performed 12/14/2016 FINDINGS: The lungs are well-aerated. Mild peribronchial thickening is noted. There is no evidence of focal opacification, pleural effusion or pneumothorax. The heart is normal in size; the mediastinal contour is within normal limits. No acute osseous abnormalities are seen. Thoracic spinal stimulation leads are partially imaged. IMPRESSION: Mild peribronchial thickening noted.  Lungs otherwise clear. Electronically Signed   By: Garald Balding M.D.   On: 12/24/2016 04:18    Procedures Procedures (including critical care time)  Medications Ordered  in ED Medications  nitroGLYCERIN (NITROSTAT) SL tablet 0.4 mg (0.4 mg Sublingual Given 12/24/16 0322)  aspirin chewable tablet 324 mg (324 mg Oral Given 12/24/16 0321)     Initial Impression / Assessment and Plan / ED Course  I have reviewed the triage vital signs and the nursing notes.  Pertinent labs & imaging results that were available during my care of the patient were reviewed by me and considered in my medical decision making (see chart for details).  Clinical Course as of Dec 25 947  Tue Dec 24, 2016  4196 D/w Dr. Radford Pax. Asks for repeat lab troponin and call back with result and cards will eval in AM. CP remains a 4, although he has also been sleeping. No change with nitro  [SG]    Clinical Course User Index [SG] Sherwood Gambler, MD    Patient's troponin is stably mildly elevated at 0.03. No dynamic ECG changes. Cardiology has been consulted and will see the patient. Care transferred to Dr. Leonette Monarch with cardiology consult and disposition   Final Clinical Impressions(s) / ED Diagnoses   Final diagnoses:  None    New Prescriptions New Prescriptions   No medications on file   I personally performed the services described in this documentation, which was scribed in my presence. The recorded information has been reviewed and is accurate.     Sherwood Gambler, MD 12/24/16 Orson Eva    Sherwood Gambler, MD 12/24/16 [phone]

## 2016-12-24 NOTE — Progress Notes (Signed)
ANTICOAGULATION CONSULT NOTE - Initial Consult  Pharmacy Consult for heparin Indication: chest pain/ACS  No Known Allergies  Patient Measurements: Height: 5\' 10"  (177.8 cm) Weight: 195 lb (88.5 kg) IBW/kg (Calculated) : 73 Heparin Dosing Weight: 88.5kg  Vital Signs: Temp: 98.2 F (36.8 C) (06/12 0142) Temp Source: Oral (06/12 0142) BP: 157/93 (06/12 1101) Pulse Rate: 67 (06/12 1101)  Labs:  Recent Labs  12/24/16 0147 12/24/16 0550  HGB 15.7  --   HCT 47.7  --   PLT 242  --   APTT 28  --   LABPROT 12.3  --   INR 0.91  --   CREATININE 1.22  --   TROPONINI 0.03* 0.03*    Estimated Creatinine Clearance: 68.5 mL/min (by C-G formula based on SCr of 1.22 mg/dL).   Medical History: Past Medical History:  Diagnosis Date  . Arthritis   . CAD (coronary artery disease), native coronary artery    PCI--> RCA '09  . COPD (chronic obstructive pulmonary disease) ([place])   . Heart attack ([place])   . Hyperlipidemia   . Hypertension   . Low back pain     Medications:  Infusions:  . heparin      Assessment: 66 yom presented to the ED with CP. Recently left AMA from OSH with same problem as he didn't want to stay for cardiac cath. To start IV heparin. Baseline CBC and INR are WNL. He is not on anticoagulation PTA.   Goal of Therapy:  Heparin level 0.3-0.7 units/ml Monitor platelets by anticoagulation protocol: Yes   Plan:  Heparin bolus 4000 units IV x 1 Heparin gtt 1100 units/hr Check a 6 hr heparin level Daily heparin level and CBC  Shellee Streng, Rande Lawman 12/24/2016,1:40 PM

## 2016-12-25 ENCOUNTER — Encounter (HOSPITAL_COMMUNITY)
Admission: EM | Disposition: A | Discharge status: Home / Self Care | Payer: Self-pay | Source: Home / Self Care | Attending: Cardiovascular Disease

## 2016-12-25 DIAGNOSIS — I25110 Atherosclerotic heart disease of native coronary artery with unstable angina pectoris: Secondary | ICD-10-CM

## 2016-12-25 DIAGNOSIS — I200 Unstable angina: Secondary | ICD-10-CM

## 2016-12-25 DIAGNOSIS — E782 Mixed hyperlipidemia: Secondary | ICD-10-CM

## 2016-12-25 DIAGNOSIS — Z720 Tobacco use: Secondary | ICD-10-CM

## 2016-12-25 DIAGNOSIS — F17200 Nicotine dependence, unspecified, uncomplicated: Secondary | ICD-10-CM

## 2016-12-25 HISTORY — PX: CORONARY STENT INTERVENTION: CATH118234

## 2016-12-25 HISTORY — PX: CORONARY BALLOON ANGIOPLASTY: CATH118233

## 2016-12-25 HISTORY — PX: LEFT HEART CATH AND CORONARY ANGIOGRAPHY: CATH118249

## 2016-12-25 LAB — CBC
HEMATOCRIT: 44.3 % (ref 39.0–52.0)
Hemoglobin: 14.2 g/dL (ref 13.0–17.0)
MCH: 28.6 pg (ref 26.0–34.0)
MCHC: 32.1 g/dL (ref 30.0–36.0)
MCV: 89.1 fL (ref 78.0–100.0)
PLATELETS: 251 10*3/uL (ref 150–400)
RBC: 4.97 MIL/uL (ref 4.22–5.81)
RDW: 13.4 % (ref 11.5–15.5)
WBC: 12.1 10*3/uL — AB (ref 4.0–10.5)

## 2016-12-25 LAB — BASIC METABOLIC PANEL
ANION GAP: 10 (ref 5–15)
BUN: 23 mg/dL — ABNORMAL HIGH (ref 6–20)
CALCIUM: 8.7 mg/dL — AB (ref 8.9–10.3)
CHLORIDE: 99 mmol/L — AB (ref 101–111)
CO2: 26 mmol/L (ref 22–32)
Creatinine, Ser: 1.22 mg/dL (ref 0.61–1.24)
GFR calc non Af Amer: >60 mL/min (ref >60)
Glucose, Bld: 92 mg/dL (ref 65–99)
Potassium: 3.8 mmol/L (ref 3.5–5.1)
Sodium: 135 mmol/L (ref 135–145)

## 2016-12-25 LAB — HIV ANTIBODY (ROUTINE TESTING W REFLEX): HIV Screen 4th Generation wRfx: NONREACTIVE

## 2016-12-25 LAB — POCT ACTIVATED CLOTTING TIME
ACTIVATED CLOTTING TIME: 246 s
ACTIVATED CLOTTING TIME: 301 s
Activated Clotting Time: 257 seconds
Activated Clotting Time: 312 seconds

## 2016-12-25 LAB — TROPONIN I: Troponin I: 0.03 ng/mL (ref <0.03)

## 2016-12-25 LAB — HEPARIN LEVEL (UNFRACTIONATED): Heparin Unfractionated: 0.46 IU/mL (ref 0.30–0.70)

## 2016-12-25 SURGERY — LEFT HEART CATH AND CORONARY ANGIOGRAPHY
Anesthesia: LOCAL

## 2016-12-25 MED ORDER — MIDAZOLAM HCL 2 MG/2ML IJ SOLN
INTRAMUSCULAR | Status: AC
  Filled 2016-12-25: qty 2

## 2016-12-25 MED ORDER — HEPARIN SODIUM (PORCINE) 1000 UNIT/ML IJ SOLN
INTRAMUSCULAR | Status: AC
  Filled 2016-12-25: qty 1

## 2016-12-25 MED ORDER — SODIUM CHLORIDE 0.9% FLUSH: 3.0000 mL | Freq: Two times a day (BID) | INTRAVENOUS | Status: DC

## 2016-12-25 MED ORDER — TICAGRELOR 90 MG PO TABS
ORAL_TABLET | ORAL | Status: AC
  Filled 2016-12-25: qty 2

## 2016-12-25 MED ORDER — HEPARIN (PORCINE) IN NACL 2-0.9 UNIT/ML-% IJ SOLN
INTRAMUSCULAR | Status: AC | PRN
  Administered 2016-12-25: 1000 mL

## 2016-12-25 MED ORDER — IOPAMIDOL (ISOVUE-370) INJECTION 76%
INTRAVENOUS | Status: AC
  Filled 2016-12-25: qty 100

## 2016-12-25 MED ORDER — TICAGRELOR 90 MG PO TABS
90.0000 mg | ORAL_TABLET | Freq: Two times a day (BID) | ORAL | Status: DC
  Administered 2016-12-26 (×2): 90 mg via ORAL
  Filled 2016-12-25 (×2): qty 1

## 2016-12-25 MED ORDER — IOPAMIDOL (ISOVUE-370) INJECTION 76%
INTRAVENOUS | Status: AC
Start: 2016-12-25 — End: 2016-12-25
  Filled 2016-12-25: qty 100

## 2016-12-25 MED ORDER — HYDRALAZINE HCL 20 MG/ML IJ SOLN: 5.0000 mg | INTRAMUSCULAR | Status: AC | PRN

## 2016-12-25 MED ORDER — SODIUM CHLORIDE 0.9% FLUSH: 3.0000 mL | INTRAVENOUS | Status: DC | PRN

## 2016-12-25 MED ORDER — TICAGRELOR 90 MG PO TABS
ORAL_TABLET | ORAL | Status: DC | PRN
  Administered 2016-12-25: 180 mg via ORAL

## 2016-12-25 MED ORDER — LIDOCAINE HCL (PF) 1 % IJ SOLN
INTRAMUSCULAR | Status: DC | PRN
  Administered 2016-12-25: 2 mL

## 2016-12-25 MED ORDER — ANGIOPLASTY BOOK
Freq: Once | Status: AC
Start: 2016-12-25 — End: 2016-12-25
  Administered 2016-12-25: 20:00:00
  Filled 2016-12-25: qty 1

## 2016-12-25 MED ORDER — VERAPAMIL HCL 2.5 MG/ML IV SOLN
INTRAVENOUS | Status: DC | PRN
  Administered 2016-12-25: 10 mL via INTRA_ARTERIAL

## 2016-12-25 MED ORDER — HEPARIN SODIUM (PORCINE) 1000 UNIT/ML IJ SOLN
INTRAMUSCULAR | Status: DC | PRN
  Administered 2016-12-25: 5000 [IU] via INTRAVENOUS
  Administered 2016-12-25 (×2): 4000 [IU] via INTRAVENOUS
  Administered 2016-12-25: 3000 [IU] via INTRAVENOUS

## 2016-12-25 MED ORDER — NITROGLYCERIN 1 MG/10 ML FOR IR/CATH LAB
INTRA_ARTERIAL | Status: AC
  Filled 2016-12-25: qty 10

## 2016-12-25 MED ORDER — MIDAZOLAM HCL 2 MG/2ML IJ SOLN
INTRAMUSCULAR | Status: DC | PRN
  Administered 2016-12-25: 1 mg via INTRAVENOUS
  Administered 2016-12-25: 2 mg via INTRAVENOUS

## 2016-12-25 MED ORDER — FENTANYL CITRATE (PF) 100 MCG/2ML IJ SOLN
INTRAMUSCULAR | Status: AC
  Filled 2016-12-25: qty 2

## 2016-12-25 MED ORDER — SODIUM CHLORIDE 0.9 % IV SOLN: 250.0000 mL | INTRAVENOUS | Status: DC | PRN

## 2016-12-25 MED ORDER — LIDOCAINE HCL (PF) 1 % IJ SOLN
INTRAMUSCULAR | Status: AC
  Filled 2016-12-25: qty 30

## 2016-12-25 MED ORDER — FENTANYL CITRATE (PF) 100 MCG/2ML IJ SOLN
INTRAMUSCULAR | Status: DC | PRN
  Administered 2016-12-25: 25 ug via INTRAVENOUS

## 2016-12-25 MED ORDER — HEPARIN (PORCINE) IN NACL 2-0.9 UNIT/ML-% IJ SOLN
INTRAMUSCULAR | Status: AC
  Filled 2016-12-25: qty 1000

## 2016-12-25 MED ORDER — SODIUM CHLORIDE 0.9 % WEIGHT BASED INFUSION
1.0000 mL/kg/h | INTRAVENOUS | Status: AC
  Administered 2016-12-25: 1 mL/kg/h via INTRAVENOUS

## 2016-12-25 MED ORDER — LABETALOL HCL 5 MG/ML IV SOLN: 10.0000 mg | INTRAVENOUS | Status: AC | PRN

## 2016-12-25 MED ORDER — IOPAMIDOL (ISOVUE-370) INJECTION 76%
INTRAVENOUS | Status: DC | PRN
  Administered 2016-12-25: 190 mL via INTRA_ARTERIAL

## 2016-12-25 MED ORDER — NITROGLYCERIN 1 MG/10 ML FOR IR/CATH LAB
INTRA_ARTERIAL | Status: DC | PRN
  Administered 2016-12-25 (×2): 100 ug via INTRACORONARY
  Administered 2016-12-25: 150 ug via INTRACORONARY

## 2016-12-25 MED ORDER — VERAPAMIL HCL 2.5 MG/ML IV SOLN
INTRAVENOUS | Status: AC
  Filled 2016-12-25: qty 2

## 2016-12-25 MED ORDER — HEPARIN SODIUM (PORCINE) 1000 UNIT/ML IJ SOLN
INTRAMUSCULAR | Status: AC
Start: 2016-12-25 — End: 2016-12-25
  Filled 2016-12-25: qty 1

## 2016-12-25 SURGICAL SUPPLY — 24 items
BALLN EUPHORA RX 1.5X12 (BALLOONS) ×2
BALLN EUPHORA RX 2.0X15 (BALLOONS) ×2
BALLN EUPHORA RX 2.5X12 (BALLOONS) ×2
BALLN ~~LOC~~ EUPHORA RX 3.5X8 (BALLOONS) ×2
BALLOON EUPHORA RX 1.5X12 (BALLOONS) IMPLANT
BALLOON EUPHORA RX 2.0X15 (BALLOONS) IMPLANT
BALLOON EUPHORA RX 2.5X12 (BALLOONS) IMPLANT
BALLOON ~~LOC~~ EUPHORA RX 3.5X8 (BALLOONS) IMPLANT
CATH 5FR JL3.5 JR4 ANG PIG MP (CATHETERS) ×1 IMPLANT
CATH LAUNCHER 6FR JR4 (CATHETERS) ×1 IMPLANT
DEVICE RAD COMP TR BAND LRG (VASCULAR PRODUCTS) ×1 IMPLANT
GLIDESHEATH SLEND SS 6F .021 (SHEATH) ×1 IMPLANT
GUIDEWIRE INQWIRE 1.5J.035X260 (WIRE) IMPLANT
INQWIRE 1.5J .035X260CM (WIRE) ×2
KIT ENCORE 26 ADVANTAGE (KITS) ×1 IMPLANT
KIT HEART LEFT (KITS) ×2 IMPLANT
PACK CARDIAC CATHETERIZATION (CUSTOM PROCEDURE TRAY) ×2 IMPLANT
STENT PROMUS PREM MR 3.0X20 (Permanent Stent) ×1 IMPLANT
STENT PROMUS PREM MR 3.5X12 (Permanent Stent) ×1 IMPLANT
SYR MEDRAD MARK V 150ML (SYRINGE) ×2 IMPLANT
TRANSDUCER W/STOPCOCK (MISCELLANEOUS) ×2 IMPLANT
TUBING CIL FLEX 10 FLL-RA (TUBING) ×2 IMPLANT
WIRE COUGAR XT STRL 190CM (WIRE) ×1 IMPLANT
WIRE HI TORQ WHISPER MS 190CM (WIRE) ×1 IMPLANT

## 2016-12-25 NOTE — Progress Notes (Signed)
Progress Note  Patient Name: Nathan Pham Date of Encounter: 12/25/2016  Primary Cardiologist: Dr. Geraldo Pitter  Subjective   Continues to have mild chest discomfort. Anxious to have cath done today.   Inpatient Medications    Scheduled Meds: . amLODipine  5 mg Oral Daily  . aspirin EC  81 mg Oral Daily  . baclofen  10 mg Oral BID  . hydrochlorothiazide  25 mg Oral Daily  . isosorbide mononitrate  30 mg Oral Daily  . lisinopril  40 mg Oral Daily  . metoprolol succinate  50 mg Oral Daily  . nicotine  21 mg Transdermal Daily  . pravastatin  80 mg Oral Daily  . senna  1 tablet Oral Daily  . sodium chloride flush  3 mL Intravenous Q12H  . umeclidinium-vilanterol  1 puff Inhalation Daily   Continuous Infusions: . sodium chloride    . sodium chloride 1 mL/kg/hr (12/25/16 0656)  . heparin 1,100 Units/hr (12/25/16 0828)   PRN Meds: sodium chloride, acetaminophen, albuterol, nitroGLYCERIN, ondansetron (ZOFRAN) IV, oxyCODONE, sodium chloride flush   Vital Signs    Vitals:   12/24/16 1520 12/24/16 2140 12/25/16 0344 12/25/16 0436  BP: (!) 169/87 98/66 100/65   Pulse: 68 65    Resp:      Temp: 97.7 F (36.5 C) 98.2 F (36.8 C) 98.1 F (36.7 C)   TempSrc: Oral Oral Oral   SpO2: 98% 93% 94% 94%  Weight:   192 lb 11.2 oz (87.4 kg)   Height:   5\' 10"  (1.778 m)     Intake/Output Summary (Last 24 hours) at 12/25/16 1031 Last data filed at 12/25/16 0600  Gross per 24 hour  Intake           785.45 ml  Output              850 ml  Net           -64.55 ml   Filed Weights   12/24/16 0142 12/25/16 0344  Weight: 195 lb (88.5 kg) 192 lb 11.2 oz (87.4 kg)    Telemetry    SR - Personally Reviewed  ECG    N/a - Personally Reviewed  Physical Exam   General: Well developed, well nourished, male appearing in no acute distress. Head: Normocephalic, atraumatic.  Neck: Supple without bruits, JVD. Lungs:  Resp regular and unlabored, CTA. Heart: RRR, S1, S2, no S3, S4, or  murmur; no rub. Abdomen: Soft, non-tender, non-distended with normoactive bowel sounds. No hepatomegaly. No rebound/guarding. No obvious abdominal masses. Extremities: No clubbing, cyanosis, edema. Distal pedal pulses are 2+ bilaterally. Neuro: Alert and oriented X 3. Moves all extremities spontaneously. Psych: Normal affect.  Labs    Chemistry Recent Labs Lab 12/24/16 0147 12/25/16 0353  NA 140 135  K 3.9 3.8  CL 103 99*  CO2 28 26  GLUCOSE 75 92  BUN 26* 23*  CREATININE 1.22 1.22  CALCIUM 9.5 8.7*  PROT 6.8  --   ALBUMIN 4.0  --   AST 18  --   ALT 16*  --   ALKPHOS 87  --   BILITOT 0.3  --   GFRNONAA >60 >60  GFRAA >60 >60  ANIONGAP 9 10     Hematology Recent Labs Lab 12/24/16 0147 12/25/16 0353  WBC 10.8* 12.1*  RBC 5.23 4.97  HGB 15.7 14.2  HCT 47.7 44.3  MCV 91.2 89.1  MCH 30.0 28.6  MCHC 32.9 32.1  RDW 13.6 13.4  PLT 242  251    Cardiac Enzymes Recent Labs Lab 12/24/16 0550 12/24/16 1546 12/24/16 2110 12/25/16 0353  TROPONINI 0.03* <0.03 <0.03 0.03*    Recent Labs Lab 12/24/16 0157  TROPIPOC 0.02    Lipid Panel     Component Value Date/Time   CHOL 146 12/24/2016 0150   TRIG 275 (H) 12/24/2016 0150   HDL 32 (L) 12/24/2016 0150   CHOLHDL 4.6 12/24/2016 0150   VLDL 55 (H) 12/24/2016 0150   LDLCALC 59 12/24/2016 0150   BNPNo results for input(s): BNP, PROBNP in the last 168 hours.   DDimer No results for input(s): DDIMER in the last 168 hours.    Radiology    Dg Chest 2 View  Result Date: 12/24/2016 CLINICAL DATA:  Acute onset of generalized chest pain. Initial encounter. EXAM: CHEST  2 VIEW COMPARISON:  Chest radiograph performed 12/14/2016 FINDINGS: The lungs are well-aerated. Mild peribronchial thickening is noted. There is no evidence of focal opacification, pleural effusion or pneumothorax. The heart is normal in size; the mediastinal contour is within normal limits. No acute osseous abnormalities are seen. Thoracic spinal  stimulation leads are partially imaged. IMPRESSION: Mild peribronchial thickening noted.  Lungs otherwise clear. Electronically Signed   By: Garald Balding M.D.   On: 12/24/2016 04:18    Cardiac Studies   N/a  Patient Profile     66 y.o. male  with PMH of CAD s/p RCA ('09), HTN, HL, COPD and tobacco use who presented with chest pain ongoing for the past 2 weeks.   Assessment & Plan    1. Unstable Angina: Reports symptoms began about 2 weeks ago and have lingered since that time. Was seen at [facility] and transferred to [facility], but signed out AMA due to not wanting to wait for cath. Symptoms returned last night, and were worse. In the ED trop 0.03x3. EKG shows RBBB, but no previous to compare to. Given SL nitro with minimal improvement in pain.  -- continue IV heparin, BB, ASA, statin, ACEi, Imdur -- planned for cardiac cath today  2. HTN: Elevated in the ED, but had not received morning medications yet.  -- continue metoprolol, ACEi, HCTZ, added amlodipine/imdur yesterday  3. HL: LDL at goal -- continue pravastatin  4. COPD: Stable, mild wheezing on exam  5. Tobacco Use: States he has reduced his tobacco use from 2 packs a day to 2/3 cigarettes a day. Insistent on smoking, but has not attempted this admission.   Signed, Reino Bellis, NP  12/25/2016, 10:31 AM   Agree with assessment and plan. Fortunately patient stayed in hospital with unstable anginal symptoms. Troponins negative.  Undergoing cath and PCI to RCA with high grade stenosis. Will need smoking cessation; improved treatment of lipid abnormalities, and long tern DAPT.   Troy Sine, MD, [facility] 12/25/2016 5:14 PM

## 2016-12-25 NOTE — Interval H&P Note (Signed)
Cath Lab Visit (complete for each Cath Lab visit)  Clinical Evaluation Leading to the Procedure:   ACS: Yes.    Non-ACS:    Anginal Classification: CCS IV  Anti-ischemic medical therapy: Maximal Therapy (2 or more classes of medications)  Non-Invasive Test Results: No non-invasive testing performed  Prior CABG: No previous CABG      History and Physical Interval Note:  12/25/2016 3:26 PM  Nathan Pham  has presented today for surgery, with the diagnosis of cp  The various methods of treatment have been discussed with the patient and family. After consideration of risks, benefits and other options for treatment, the patient has consented to  Procedure(s): Left Heart Cath and Coronary Angiography (N/A) as a surgical intervention .  The patient's history has been reviewed, patient examined, no change in status, stable for surgery.  I have reviewed the patient's chart and labs.  Questions were answered to the patient's satisfaction.     Sherren Mocha

## 2016-12-25 NOTE — Plan of Care (Signed)
Problem: Education: Goal: Knowledge of [facility] Education information/materials will improve Outcome: Progressing Patient educated on pre-cath procedures: chewable ASA, IV fluids started, continuation of heparin until procedure, and clipping of wrist/groin.   Problem: Pain Managment: Goal: General experience of comfort will improve Outcome: Progressing Patient has had complaints of back pain ranging 6-7, PRN pain medication given per eMAR. Brings pain down to a 4 which is usual for him. Chest pain maintains at a 4, patient offered nitro, he would not like to take it. Cath to be done today.   Problem: Activity: Goal: Risk for activity intolerance will decrease Outcome: Progressing Patient up walking around room and up/down hall.

## 2016-12-26 ENCOUNTER — Other Ambulatory Visit (HOSPITAL_COMMUNITY): Payer: Medicare Other

## 2016-12-26 ENCOUNTER — Encounter (HOSPITAL_COMMUNITY): Payer: Self-pay | Admitting: Cardiovascular Disease

## 2016-12-26 DIAGNOSIS — I25110 Atherosclerotic heart disease of native coronary artery with unstable angina pectoris: Secondary | ICD-10-CM

## 2016-12-26 LAB — CBC
HCT: 42.4 % (ref 39.0–52.0)
Hemoglobin: 13.9 g/dL (ref 13.0–17.0)
MCH: 29.1 pg (ref 26.0–34.0)
MCHC: 32.8 g/dL (ref 30.0–36.0)
MCV: 88.7 fL (ref 78.0–100.0)
PLATELETS: 190 10*3/uL (ref 150–400)
RBC: 4.78 MIL/uL (ref 4.22–5.81)
RDW: 13.3 % (ref 11.5–15.5)
WBC: 9 10*3/uL (ref 4.0–10.5)

## 2016-12-26 LAB — BASIC METABOLIC PANEL
Anion gap: 8 (ref 5–15)
BUN: 24 mg/dL — AB (ref 6–20)
CALCIUM: 8.6 mg/dL — AB (ref 8.9–10.3)
CO2: 24 mmol/L (ref 22–32)
CREATININE: 1.14 mg/dL (ref 0.61–1.24)
Chloride: 105 mmol/L (ref 101–111)
GFR calc Af Amer: >60 mL/min (ref >60)
GFR calc non Af Amer: >60 mL/min (ref >60)
GLUCOSE: 83 mg/dL (ref 65–99)
Potassium: 4 mmol/L (ref 3.5–5.1)
Sodium: 137 mmol/L (ref 135–145)

## 2016-12-26 MED ORDER — TICAGRELOR 90 MG PO TABS: 90.0000 mg | ORAL_TABLET | Freq: Two times a day (BID) | ORAL | 10 refills | Status: DC

## 2016-12-26 MED ORDER — TICAGRELOR 90 MG PO TABS: 90.0000 mg | ORAL_TABLET | Freq: Two times a day (BID) | ORAL | 0 refills | Status: DC

## 2016-12-26 MED ORDER — AMLODIPINE BESYLATE 5 MG PO TABS: 5.0000 mg | ORAL_TABLET | Freq: Every day | ORAL | 2 refills | Status: DC

## 2016-12-26 MED ORDER — ISOSORBIDE MONONITRATE ER 30 MG PO TB24: 30.0000 mg | ORAL_TABLET | Freq: Every day | ORAL | 2 refills | Status: DC

## 2016-12-26 MED ORDER — ASPIRIN 81 MG PO TBEC: 81.0000 mg | DELAYED_RELEASE_TABLET | Freq: Every day | ORAL | Status: DC

## 2016-12-26 NOTE — Discharge Summary (Signed)
Discharge Summary    Patient ID: Nathan Pham,  MRN: [id number], DOB/AGE: 64-Oct-1952 64 y.o.  Admit date: 12/24/2016 Discharge date: 12/26/2016  Primary Care Provider: Jacqlyn Larsen II Primary Cardiologist: Renvankar  Discharge Diagnoses    Active Problems:   Chest pain   Hyperlipemia, mixed   Coronary artery disease involving native coronary artery of native heart with unstable angina pectoris ([place])   Allergies No Known Allergies  Diagnostic Studies/Procedures    LHC: 12/25/16  Conclusion   1. Severe 2 vessel CAD with severe stenosis in the mid/distal RCA and right PLA branches and chronic occlusion of the distal LCx filling late from left-to-left collaterals 2. Preserved LV systolic function with LVEF estimated at 55% 3. Successful PCI of the RCA using Promus DES in the mid and distal vessel and balloon angioplasty of the left PLA 4. Diffuse nonobstructive disease of the LAD  Recommend: aggressive medical therapy and risk factor modification/smoking cessation. Continue DAPT with ASA and brilinta at least 12 months  _____________   History of Present Illness     Nathan Pham is a 66 yo male with PMH of CAD s/p RCA ('09), HTN, HL, COPD and tobacco use who is followed in [place]. Reports weeks ago on Friday he developed central chest pain with radiation into the left arm while driving. He presented to the ED at [facility] and was seen by Cardiology there who recommended a cardiac cath. He was transferred to [facility] for cath on Sunday but was then told that he would not be able to have cath done until Monday. He signed out AMA from there. States he has continued to have dull chest tightness since that time. The night prior to admission around 11pm his symptoms worsened and his left arm began to hurt again.   Presented to the [facility] ED. Labs showed stable electrolytes, Trop 0.03x2, LDL 59, Hgb 15.7. EKG showed SR with RBBB, with no previous to compare. He was given SL  nitro in the ED with minimal improvement in symptoms. CXR was negative. Cardiology was called for admission.   Hospital Course     He was admitted and started on IV heparin. Enzymes cycled 0.03 x3. He underwent LHC with Dr. Burt Knack noted above with DES x2 to [facility] and DES x1 to pRCA, and balloon angioplasty to the left PLA. Plan for DAPT with ASA/Brilinta. Moderate diffuse disease noted in LAD. EF noted at 55%. Will need to continue with aggressive medical therapy. Morning labs showed Cr 1.14, Hgb 13.9. No further episodes of chest pain. Worked well with cardiac rehab. LDL 59, Trig 275.   He was seen by Dr. Claiborne Billings and determined stable for discharge home. He plans to follow up with his cardiologist in [place]. Medications are listed below.   General: Well developed, well nourished, male appearing in no acute distress. Head: Normocephalic, atraumatic.  Neck: Supple without bruits, JVD. Lungs:  Resp regular and unlabored, Mild expiratory wheezing. Heart: RRR, S1, S2, no S3, S4, or murmur; no rub. Abdomen: Soft, non-tender, non-distended with normoactive bowel sounds. No hepatomegaly. No rebound/guarding. No obvious abdominal masses. Extremities: No clubbing, cyanosis, edema. Distal pedal pulses are 2+ bilaterally. R cath site stable without bruising or hematoma Neuro: Alert and oriented X 3. Moves all extremities spontaneously. Psych: Normal affect.  _____________  Discharge Vitals Blood pressure (!) 141/85, pulse 75, temperature 98.3 F (36.8 C), temperature source Oral, resp. rate 16, height 5\' 10"  (1.778 m), weight 189 lb 9.5 oz (  86 kg), SpO2 95 %.  Filed Weights   12/24/16 0142 12/25/16 0344 12/26/16 0303  Weight: 195 lb (88.5 kg) 192 lb 11.2 oz (87.4 kg) 189 lb 9.5 oz (86 kg)    Labs & Radiologic Studies    CBC  Recent Labs  12/24/16 0147 12/25/16 0353 12/26/16 0312  WBC 10.8* 12.1* 9.0  NEUTROABS 6.5  --   --   HGB 15.7 14.2 13.9  HCT 47.7 44.3 42.4  MCV 91.2 89.1 88.7    PLT 242 251 841   Basic Metabolic Panel  Recent Labs  12/25/16 0353 12/26/16 0312  NA 135 137  K 3.8 4.0  CL 99* 105  CO2 26 24  GLUCOSE 92 83  BUN 23* 24*  CREATININE 1.22 1.14  CALCIUM 8.7* 8.6*   Liver Function Tests  Recent Labs  12/24/16 0147  AST 18  ALT 16*  ALKPHOS 87  BILITOT 0.3  PROT 6.8  ALBUMIN 4.0   No results for input(s): LIPASE, AMYLASE in the last 72 hours. Cardiac Enzymes  Recent Labs  12/24/16 1546 12/24/16 2110 12/25/16 0353  TROPONINI <0.03 <0.03 0.03*   BNP Invalid input(s): POCBNP D-Dimer No results for input(s): DDIMER in the last 72 hours. Hemoglobin A1C No results for input(s): HGBA1C in the last 72 hours. Fasting Lipid Panel  Recent Labs  12/24/16 0150  CHOL 146  HDL 32*  LDLCALC 59  TRIG 275*  CHOLHDL 4.6   Thyroid Function Tests No results for input(s): TSH, T4TOTAL, T3FREE, THYROIDAB in the last 72 hours.  Invalid input(s): FREET3 _____________  Dg Chest 2 View  Result Date: 12/24/2016 CLINICAL DATA:  Acute onset of generalized chest pain. Initial encounter. EXAM: CHEST  2 VIEW COMPARISON:  Chest radiograph performed 12/14/2016 FINDINGS: The lungs are well-aerated. Mild peribronchial thickening is noted. There is no evidence of focal opacification, pleural effusion or pneumothorax. The heart is normal in size; the mediastinal contour is within normal limits. No acute osseous abnormalities are seen. Thoracic spinal stimulation leads are partially imaged. IMPRESSION: Mild peribronchial thickening noted.  Lungs otherwise clear. Electronically Signed   By: Garald Balding M.D.   On: 12/24/2016 04:18   Disposition   Pt is being discharged home today in good condition.  Follow-up Plans & Appointments    Follow-up Information    Dr. Claiborne Rigg Follow up.   Why:  Please arrange a follow up in the office within the next 2 weeks.          Discharge Instructions    Amb Referral to Cardiac Rehabilitation    Complete  by:  As directed    Diagnosis:   Coronary Stents Comment - to [place] [facility]     Call MD for:  redness, tenderness, or signs of infection (pain, swelling, redness, odor or green/yellow discharge around incision site)    Complete by:  As directed    Diet - low sodium heart healthy    Complete by:  As directed    Discharge instructions    Complete by:  As directed    Radial Site Care Refer to this sheet in the next few weeks. These instructions provide you with information on caring for yourself after your procedure. Your caregiver may also give you more specific instructions. Your treatment has been planned according to current medical practices, but problems sometimes occur. Call your caregiver if you have any problems or questions after your procedure. HOME CARE INSTRUCTIONS You may shower the day after the procedure.Remove the bandage (  dressing) and gently wash the site with plain soap and water.Gently pat the site dry.  Do not apply powder or lotion to the site.  Do not submerge the affected site in water for 3 to 5 days.  Inspect the site at least twice daily.  Do not flex or bend the affected arm for 24 hours.  No lifting over 5 pounds (2.3 kg) for 5 days after your procedure.  Do not drive home if you are discharged the same day of the procedure. Have someone else drive you.  You may drive 24 hours after the procedure unless otherwise instructed by your caregiver.  What to expect: Any bruising will usually fade within 1 to 2 weeks.  Blood that collects in the tissue (hematoma) may be painful to the touch. It should usually decrease in size and tenderness within 1 to 2 weeks.  SEEK IMMEDIATE MEDICAL CARE IF: You have unusual pain at the radial site.  You have redness, warmth, swelling, or pain at the radial site.  You have drainage (other than a small amount of blood on the dressing).  You have chills.  You have a fever or persistent symptoms for more than 72 hours.  You have a  fever and your symptoms suddenly get worse.  Your arm becomes pale, cool, tingly, or numb.  You have heavy bleeding from the site. Hold pressure on the site.  MAKE SURE YOU DO NOT MISS ANY DOSES OF YOUR BRILINTA!!!   Increase activity slowly    Complete by:  As directed       Discharge Medications   Current Discharge Medication List    START taking these medications   Details  amLODipine (NORVASC) 5 MG tablet Take 1 tablet (5 mg total) by mouth daily. Qty: 30 tablet, Refills: 2    isosorbide mononitrate (IMDUR) 30 MG 24 hr tablet Take 1 tablet (30 mg total) by mouth daily. Qty: 30 tablet, Refills: 2    ticagrelor (BRILINTA) 90 MG TABS tablet Take 1 tablet (90 mg total) by mouth 2 (two) times daily. Qty: 60 tablet, Refills: 10      CONTINUE these medications which have CHANGED   Details  aspirin EC 81 MG EC tablet Take 1 tablet (81 mg total) by mouth daily.      CONTINUE these medications which have NOT CHANGED   Details  albuterol (PROVENTIL HFA;VENTOLIN HFA) 108 (90 Base) MCG/ACT inhaler Inhale 2 puffs into the lungs every 6 (six) hours as needed for wheezing or shortness of breath.    baclofen (LIORESAL) 10 MG tablet Take 10 mg by mouth 2 (two) times daily.    docusate sodium ([employer] STOOL SOFTENER) 50 MG capsule Take 100 mg by mouth daily.     ergocalciferol (VITAMIN D2) 50000 units capsule Take 50,000 Units by mouth once a week. THURSDAY    hydrochlorothiazide (HYDRODIURIL) 25 MG tablet Take 25 mg by mouth daily.    lisinopril (PRINIVIL,ZESTRIL) 40 MG tablet Take 40 mg by mouth daily.    metoprolol succinate (TOPROL-XL) 50 MG 24 hr tablet Take 50 mg by mouth daily. Take with or immediately following a meal.    Multiple Vitamin (MULTIVITAMIN) capsule Take 1 capsule by mouth daily.    nitroGLYCERIN (NITROSTAT) 0.4 MG SL tablet Place 0.4 mg under the tongue every 5 (five) minutes as needed for chest pain.    Oxycodone HCl 10 MG TABS Take 10 mg by mouth 4 (four) times  daily as needed (pain).  pravastatin (PRAVACHOL) 80 MG tablet Take 80 mg by mouth daily.    ranitidine (ZANTAC) 300 MG tablet Take 300 mg by mouth daily.    senna (SENOKOT) 8.6 MG TABS tablet Take 1 tablet by mouth daily.    umeclidinium-vilanterol (ANORO ELLIPTA) 62.5-25 MCG/INH AEPB Inhale 1 puff into the lungs daily.         Aspirin prescribed at discharge?  Yes High Intensity Statin Prescribed? (Lipitor 40-80mg  or Crestor 20-40mg ): Yes Beta Blocker Prescribed? Yes For EF <40%, was ACEI/ARB Prescribed? Yes ADP Receptor Inhibitor Prescribed? (i.e. Plavix etc.-Includes Medically Managed Patients): Yes For EF <40%, Aldosterone Inhibitor Prescribed? No: EF ok Was EF assessed during THIS hospitalization? Yes Was Cardiac Rehab II ordered? (Included Medically managed Patients): Yes   Outstanding Labs/Studies   N/A  Duration of Discharge Encounter   Greater than 30 minutes including physician time.  Signed, Reino Bellis NP-C 12/26/2016, 11:56 AM    Patient seen and examined. Agree with assessment and plan. No recurrent chest pain. Cath data reviewed; PCI to RCA at several sites. Walking today without recurrent chest pin. DC today; f/u with Dr. Claiborne Rigg.    Troy Sine, MD, [facility] 12/26/2016 11:56 AM

## 2016-12-26 NOTE — Progress Notes (Signed)
TR BAND REMOVAL  LOCATION:    right radial  DEFLATED PER PROTOCOL:    Yes.    TIME BAND OFF / DRESSING APPLIED:    21:15   SITE UPON ARRIVAL:    Level 0  SITE AFTER BAND REMOVAL:    Level 0  CIRCULATION SENSATION AND MOVEMENT:    Within Normal Limits   Yes.    COMMENTS:   Post TR band instructions given. Pt tolerated well. 

## 2016-12-26 NOTE — Progress Notes (Signed)
CARDIAC REHAB PHASE I   PRE:  Rate/Rhythm: 80 SR  BP:  Sitting: 141/85      MODE:  Ambulation: 600 ft   POST:  Rate/Rhythm: 89 SR  BP:  Sitting: 133/79       Pt ambulated 600 ft on RA, cane, independent, steady gait, tolerated well with no complaints. Completed PCI/stent education.  Reviewed risk factors, tobacco cessation (pt not willing to quit at this time), PCI book, anti-platelet therapy, stent card, activity restrictions, ntg, exercise, heart healthy diet and phase 2 cardiac rehab. Pt verbalized understanding. Pt agrees to phase 2 cardiac rehab referral, will send to [facility]. Pt to see case manager regarding brilinta prior to discharge. Pt up ad lib, walking hallways, awaiting discharge.    [id number] Lenna Sciara, RN, BSN 12/26/2016 8:48 AM

## 2016-12-26 NOTE — Care Management Note (Addendum)
Case Management Note  Patient Details  Name: TROYCE GIESKE MRN: [id number] Date of Birth: March 17, 1951  Subjective/Objective:      From home with sone and daughter n law, pta indep with cane, s/p coronary stent intervention, ambulated with cardiac rehab, NCM awaiting benefit check for brilinta, NCM gave patient 30 day savings card. He has [employer].  [facility] [address] [facility] [place] [facility] brilinta in [facility]. His cell phone is [phone].   NCM called patient and told him co pay is 45.00, he was ok with that.  PCP Lovette Cliche II               Action/Plan: NCM will follow for dc needs.  Expected Discharge Date:                  Expected Discharge Plan:  Home/Self Care  In-House Referral:     Discharge planning Services  CM Consult  Post Acute Care Choice:    Choice offered to:     DME Arranged:    DME Agency:     HH Arranged:    [place] Agency:     Status of Service:  Completed, signed off  If discussed at [employer] of Stay Meetings, dates discussed:    Additional Comments:  Zenon Mayo, RN 12/26/2016, 8:53 AM

## 2016-12-26 NOTE — Progress Notes (Signed)
1.  S/W JAMIE @ [place] [facility] # [phone]   1. BRILINTA 90 MG BID   COVER- YES  CO-PAY- $ 45.00  TIER- 3 DRUG  PRIOR APPROVAL- NO   PREFERRED PHARMACY : [facility]

## 2016-12-27 NOTE — Consult Note (Signed)
           [facility] CM Primary Care Navigator  12/27/2016  Nathan Pham [birth year] [id number]   Went to see patientat the bedsideto identify possible discharge needs but he was alreadydischarged per staff report.  Patient was discharged home yesterday.  Primary care provider's office called Caryl Pina for [place], RN)to notify of patient's discharge and need for post hospital follow-up and transition of care.   Made aware to refer patient to [facility] care management ifdeemed appropriatefor services.   For questions, please contact:  Dannielle Huh, BSN, RN- [facility] Primary Care Navigator  Telephone: [phone] [place]

## 2016-12-29 DIAGNOSIS — I808 Phlebitis and thrombophlebitis of other sites: Secondary | ICD-10-CM | POA: Diagnosis not present

## 2016-12-31 ENCOUNTER — Other Ambulatory Visit: Payer: Self-pay

## 2016-12-31 NOTE — Patient Outreach (Signed)
[facility]) Care Management  12/31/2016  Nathan Pham 11-17-1950 [id number]      Transition of Care Referral  Referral Date: 12/31/16 Referral Source: [facility] Discharge Report Date of Admission: 12/24/16 Diagnosis: chest pain Date of Discharge: 12/26/16 Facility: [facility]    Outreach attempt # 1 to patient. No answer. RN CM left HIPAA compliant message along with contact info.      Plan: RN CM will make outreach attempt to patient within one business day if no return call from patient.   Enzo Montgomery, RN,BSN,CCM [place] [facility] Telephonic Care Management Coordinator Direct Phone: [phone] Toll Free: [phone] Fax: [phone]

## 2017-01-01 ENCOUNTER — Other Ambulatory Visit: Payer: Self-pay

## 2017-01-01 DIAGNOSIS — Z720 Tobacco use: Secondary | ICD-10-CM | POA: Diagnosis not present

## 2017-01-01 NOTE — Telephone Encounter (Signed)
This encounter was created in error - please disregard.

## 2017-01-01 NOTE — Patient Outreach (Signed)
[facility]) Care Management  01/01/2017  Nathan Pham 1951/02/12 [id number]   Transition of Care Referral  Referral Date: 12/31/16 Referral Source: [facility] Discharge Report Date of Admission: 12/24/16 Diagnosis: chest pain Date of Discharge: 12/26/16 Facility: [facility]    Incoming call from patient. Patient states he is doing well since discharge form the hospital. He went to see PCP for f/u appt today. He is waiting to hear back from Dr.Renvankar's office for 2wk f/u appt as they are booked until July. He is waiting to hear back from cardiac rehab as well. He denies any issues with transportation. He has all his meds and voices no questions/concerns regarding meds. He voices no further RN CM needs or concerns. He knows who,when and how to contact if any medical changes or needs arise.    Plan: RN CM will notify [facility] administrative assistant of case status.   Enzo Montgomery, RN,BSN,CCM [employer] Management Telephonic Care Management Coordinator Direct Phone: [phone] Toll Free: [phone] Fax: [phone]

## 2017-01-01 NOTE — Patient Outreach (Signed)
[facility]) Care Management  01/01/2017  Nathan Pham 07/24/1950 [id number]   Transition of Care Referral  Referral Date: 12/31/16 Referral Source: [facility] Discharge Report Date of Admission: 12/24/16 Diagnosis: chest pain Date of Discharge: 12/26/16 Facility: [facility]    Outreach attempt #2 to patient. No answer at present. RN CM left HIPAA compliant voicemail message along with contact info.    Plan: RN CM will make outreach attempt to patient within one business day if no return call from patient.   Enzo Montgomery, RN,BSN,CCM [place] [facility] Telephonic Care Management Coordinator Direct Phone: [phone] Toll Free: [phone] Fax: [phone]

## 2017-01-02 ENCOUNTER — Ambulatory Visit: Payer: Self-pay

## 2017-01-14 DIAGNOSIS — M545 Low back pain: Secondary | ICD-10-CM | POA: Diagnosis not present

## 2017-01-14 DIAGNOSIS — G894 Chronic pain syndrome: Secondary | ICD-10-CM | POA: Diagnosis not present

## 2017-01-14 DIAGNOSIS — M25511 Pain in right shoulder: Secondary | ICD-10-CM | POA: Diagnosis not present

## 2017-01-14 DIAGNOSIS — Z79899 Other long term (current) drug therapy: Secondary | ICD-10-CM | POA: Diagnosis not present

## 2017-01-16 ENCOUNTER — Ambulatory Visit: Payer: Medicare Other | Admitting: Cardiology

## 2017-01-29 DIAGNOSIS — Z0000 Encounter for general adult medical examination without abnormal findings: Secondary | ICD-10-CM | POA: Diagnosis not present

## 2017-01-29 DIAGNOSIS — D519 Vitamin B12 deficiency anemia, unspecified: Secondary | ICD-10-CM | POA: Diagnosis not present

## 2017-02-05 DIAGNOSIS — D510 Vitamin B12 deficiency anemia due to intrinsic factor deficiency: Secondary | ICD-10-CM | POA: Diagnosis not present

## 2017-02-06 DIAGNOSIS — I25118 Atherosclerotic heart disease of native coronary artery with other forms of angina pectoris: Secondary | ICD-10-CM | POA: Diagnosis not present

## 2017-02-06 DIAGNOSIS — E785 Hyperlipidemia, unspecified: Secondary | ICD-10-CM | POA: Diagnosis not present

## 2017-02-06 DIAGNOSIS — I10 Essential (primary) hypertension: Secondary | ICD-10-CM | POA: Diagnosis not present

## 2017-02-08 DIAGNOSIS — G4733 Obstructive sleep apnea (adult) (pediatric): Secondary | ICD-10-CM | POA: Diagnosis not present

## 2017-02-12 DIAGNOSIS — D510 Vitamin B12 deficiency anemia due to intrinsic factor deficiency: Secondary | ICD-10-CM | POA: Diagnosis not present

## 2017-02-14 DIAGNOSIS — G894 Chronic pain syndrome: Secondary | ICD-10-CM | POA: Diagnosis not present

## 2017-02-14 DIAGNOSIS — M545 Low back pain: Secondary | ICD-10-CM | POA: Diagnosis not present

## 2017-02-14 DIAGNOSIS — Z79899 Other long term (current) drug therapy: Secondary | ICD-10-CM | POA: Diagnosis not present

## 2017-02-14 DIAGNOSIS — M25511 Pain in right shoulder: Secondary | ICD-10-CM | POA: Diagnosis not present

## 2017-02-14 DIAGNOSIS — Z79891 Long term (current) use of opiate analgesic: Secondary | ICD-10-CM | POA: Diagnosis not present

## 2017-02-19 DIAGNOSIS — D510 Vitamin B12 deficiency anemia due to intrinsic factor deficiency: Secondary | ICD-10-CM | POA: Diagnosis not present

## 2017-03-18 DIAGNOSIS — M5417 Radiculopathy, lumbosacral region: Secondary | ICD-10-CM | POA: Diagnosis not present

## 2017-03-18 DIAGNOSIS — Z79891 Long term (current) use of opiate analgesic: Secondary | ICD-10-CM | POA: Diagnosis not present

## 2017-03-18 DIAGNOSIS — M25511 Pain in right shoulder: Secondary | ICD-10-CM | POA: Diagnosis not present

## 2017-03-18 DIAGNOSIS — M545 Low back pain: Secondary | ICD-10-CM | POA: Diagnosis not present

## 2017-03-18 DIAGNOSIS — G894 Chronic pain syndrome: Secondary | ICD-10-CM | POA: Diagnosis not present

## 2017-03-18 DIAGNOSIS — Z79899 Other long term (current) drug therapy: Secondary | ICD-10-CM | POA: Diagnosis not present

## 2017-03-19 ENCOUNTER — Other Ambulatory Visit: Payer: Self-pay | Admitting: Pain Medicine

## 2017-03-19 DIAGNOSIS — M545 Low back pain: Secondary | ICD-10-CM

## 2017-03-20 ENCOUNTER — Ambulatory Visit
Admission: RE | Admit: 2017-03-20 | Discharge: 2017-03-20 | Disposition: A | Discharge status: Home / Self Care | Payer: Medicare Other | Source: Ambulatory Visit | Attending: Pain Medicine | Admitting: Pain Medicine

## 2017-03-20 DIAGNOSIS — M5126 Other intervertebral disc displacement, lumbar region: Secondary | ICD-10-CM | POA: Diagnosis not present

## 2017-03-20 DIAGNOSIS — M545 Low back pain: Secondary | ICD-10-CM

## 2017-03-21 DIAGNOSIS — G4733 Obstructive sleep apnea (adult) (pediatric): Secondary | ICD-10-CM | POA: Diagnosis not present

## 2017-03-24 DIAGNOSIS — J449 Chronic obstructive pulmonary disease, unspecified: Secondary | ICD-10-CM | POA: Diagnosis not present

## 2017-03-24 DIAGNOSIS — G4733 Obstructive sleep apnea (adult) (pediatric): Secondary | ICD-10-CM | POA: Diagnosis not present

## 2017-04-02 DIAGNOSIS — D510 Vitamin B12 deficiency anemia due to intrinsic factor deficiency: Secondary | ICD-10-CM | POA: Diagnosis not present

## 2017-04-17 DIAGNOSIS — M25511 Pain in right shoulder: Secondary | ICD-10-CM | POA: Diagnosis not present

## 2017-04-17 DIAGNOSIS — M5417 Radiculopathy, lumbosacral region: Secondary | ICD-10-CM | POA: Diagnosis not present

## 2017-04-17 DIAGNOSIS — M545 Low back pain: Secondary | ICD-10-CM | POA: Diagnosis not present

## 2017-04-17 DIAGNOSIS — Z79891 Long term (current) use of opiate analgesic: Secondary | ICD-10-CM | POA: Diagnosis not present

## 2017-04-17 DIAGNOSIS — G894 Chronic pain syndrome: Secondary | ICD-10-CM | POA: Diagnosis not present

## 2017-04-17 DIAGNOSIS — Z79899 Other long term (current) drug therapy: Secondary | ICD-10-CM | POA: Diagnosis not present

## 2017-04-30 DIAGNOSIS — D510 Vitamin B12 deficiency anemia due to intrinsic factor deficiency: Secondary | ICD-10-CM | POA: Diagnosis not present

## 2017-05-02 DIAGNOSIS — Z09 Encounter for follow-up examination after completed treatment for conditions other than malignant neoplasm: Secondary | ICD-10-CM | POA: Diagnosis not present

## 2017-05-02 DIAGNOSIS — Z981 Arthrodesis status: Secondary | ICD-10-CM | POA: Diagnosis not present

## 2017-05-13 DIAGNOSIS — I951 Orthostatic hypotension: Secondary | ICD-10-CM | POA: Diagnosis not present

## 2017-05-13 DIAGNOSIS — I714 Abdominal aortic aneurysm, without rupture: Secondary | ICD-10-CM | POA: Diagnosis not present

## 2017-05-13 DIAGNOSIS — D510 Vitamin B12 deficiency anemia due to intrinsic factor deficiency: Secondary | ICD-10-CM | POA: Diagnosis not present

## 2017-05-13 DIAGNOSIS — Z79899 Other long term (current) drug therapy: Secondary | ICD-10-CM | POA: Diagnosis not present

## 2017-05-15 DIAGNOSIS — G894 Chronic pain syndrome: Secondary | ICD-10-CM | POA: Diagnosis not present

## 2017-05-15 DIAGNOSIS — M25511 Pain in right shoulder: Secondary | ICD-10-CM | POA: Diagnosis not present

## 2017-05-15 DIAGNOSIS — M545 Low back pain: Secondary | ICD-10-CM | POA: Diagnosis not present

## 2017-05-15 DIAGNOSIS — M5417 Radiculopathy, lumbosacral region: Secondary | ICD-10-CM | POA: Diagnosis not present

## 2017-06-04 DIAGNOSIS — D510 Vitamin B12 deficiency anemia due to intrinsic factor deficiency: Secondary | ICD-10-CM | POA: Diagnosis not present

## 2017-06-16 DIAGNOSIS — G4733 Obstructive sleep apnea (adult) (pediatric): Secondary | ICD-10-CM | POA: Diagnosis not present

## 2017-06-16 DIAGNOSIS — J449 Chronic obstructive pulmonary disease, unspecified: Secondary | ICD-10-CM | POA: Diagnosis not present

## 2017-06-19 DIAGNOSIS — Z79899 Other long term (current) drug therapy: Secondary | ICD-10-CM | POA: Diagnosis not present

## 2017-06-19 DIAGNOSIS — M545 Low back pain: Secondary | ICD-10-CM | POA: Diagnosis not present

## 2017-06-19 DIAGNOSIS — M5417 Radiculopathy, lumbosacral region: Secondary | ICD-10-CM | POA: Diagnosis not present

## 2017-06-19 DIAGNOSIS — M25511 Pain in right shoulder: Secondary | ICD-10-CM | POA: Diagnosis not present

## 2017-06-19 DIAGNOSIS — Z79891 Long term (current) use of opiate analgesic: Secondary | ICD-10-CM | POA: Diagnosis not present

## 2017-06-19 DIAGNOSIS — G894 Chronic pain syndrome: Secondary | ICD-10-CM | POA: Diagnosis not present

## 2017-06-26 DIAGNOSIS — G4733 Obstructive sleep apnea (adult) (pediatric): Secondary | ICD-10-CM | POA: Diagnosis not present

## 2017-07-23 DIAGNOSIS — D510 Vitamin B12 deficiency anemia due to intrinsic factor deficiency: Secondary | ICD-10-CM | POA: Diagnosis not present

## 2017-07-24 DIAGNOSIS — M5417 Radiculopathy, lumbosacral region: Secondary | ICD-10-CM | POA: Diagnosis not present

## 2017-07-24 DIAGNOSIS — M25511 Pain in right shoulder: Secondary | ICD-10-CM | POA: Diagnosis not present

## 2017-07-24 DIAGNOSIS — G894 Chronic pain syndrome: Secondary | ICD-10-CM | POA: Diagnosis not present

## 2017-07-24 DIAGNOSIS — M545 Low back pain: Secondary | ICD-10-CM | POA: Diagnosis not present

## 2017-08-21 DIAGNOSIS — Z79891 Long term (current) use of opiate analgesic: Secondary | ICD-10-CM | POA: Diagnosis not present

## 2017-08-21 DIAGNOSIS — Z79899 Other long term (current) drug therapy: Secondary | ICD-10-CM | POA: Diagnosis not present

## 2017-08-21 DIAGNOSIS — M545 Low back pain: Secondary | ICD-10-CM | POA: Diagnosis not present

## 2017-08-21 DIAGNOSIS — G894 Chronic pain syndrome: Secondary | ICD-10-CM | POA: Diagnosis not present

## 2017-08-21 DIAGNOSIS — M5417 Radiculopathy, lumbosacral region: Secondary | ICD-10-CM | POA: Diagnosis not present

## 2017-08-21 DIAGNOSIS — M25511 Pain in right shoulder: Secondary | ICD-10-CM | POA: Diagnosis not present

## 2017-08-25 DIAGNOSIS — I10 Essential (primary) hypertension: Secondary | ICD-10-CM | POA: Diagnosis not present

## 2017-08-25 DIAGNOSIS — I2510 Atherosclerotic heart disease of native coronary artery without angina pectoris: Secondary | ICD-10-CM | POA: Diagnosis not present

## 2017-08-25 DIAGNOSIS — E785 Hyperlipidemia, unspecified: Secondary | ICD-10-CM | POA: Diagnosis not present

## 2017-08-28 DIAGNOSIS — D510 Vitamin B12 deficiency anemia due to intrinsic factor deficiency: Secondary | ICD-10-CM | POA: Diagnosis not present

## 2017-09-15 DIAGNOSIS — E559 Vitamin D deficiency, unspecified: Secondary | ICD-10-CM | POA: Diagnosis not present

## 2017-09-15 DIAGNOSIS — R5383 Other fatigue: Secondary | ICD-10-CM | POA: Diagnosis not present

## 2017-09-15 DIAGNOSIS — J449 Chronic obstructive pulmonary disease, unspecified: Secondary | ICD-10-CM | POA: Diagnosis not present

## 2017-09-15 DIAGNOSIS — G4733 Obstructive sleep apnea (adult) (pediatric): Secondary | ICD-10-CM | POA: Diagnosis not present

## 2017-09-18 DIAGNOSIS — M545 Low back pain: Secondary | ICD-10-CM | POA: Diagnosis not present

## 2017-09-18 DIAGNOSIS — G894 Chronic pain syndrome: Secondary | ICD-10-CM | POA: Diagnosis not present

## 2017-09-18 DIAGNOSIS — M25511 Pain in right shoulder: Secondary | ICD-10-CM | POA: Diagnosis not present

## 2017-09-18 DIAGNOSIS — M5417 Radiculopathy, lumbosacral region: Secondary | ICD-10-CM | POA: Diagnosis not present

## 2017-09-29 DIAGNOSIS — D510 Vitamin B12 deficiency anemia due to intrinsic factor deficiency: Secondary | ICD-10-CM | POA: Diagnosis not present

## 2017-10-16 DIAGNOSIS — Z79891 Long term (current) use of opiate analgesic: Secondary | ICD-10-CM | POA: Diagnosis not present

## 2017-10-16 DIAGNOSIS — G894 Chronic pain syndrome: Secondary | ICD-10-CM | POA: Diagnosis not present

## 2017-10-16 DIAGNOSIS — M25511 Pain in right shoulder: Secondary | ICD-10-CM | POA: Diagnosis not present

## 2017-10-16 DIAGNOSIS — Z79899 Other long term (current) drug therapy: Secondary | ICD-10-CM | POA: Diagnosis not present

## 2017-10-21 DIAGNOSIS — Z1331 Encounter for screening for depression: Secondary | ICD-10-CM | POA: Diagnosis not present

## 2017-10-21 DIAGNOSIS — Z0000 Encounter for general adult medical examination without abnormal findings: Secondary | ICD-10-CM | POA: Diagnosis not present

## 2017-10-21 DIAGNOSIS — Z1339 Encounter for screening examination for other mental health and behavioral disorders: Secondary | ICD-10-CM | POA: Diagnosis not present

## 2017-10-21 DIAGNOSIS — H6123 Impacted cerumen, bilateral: Secondary | ICD-10-CM | POA: Diagnosis not present

## 2017-10-21 DIAGNOSIS — M25551 Pain in right hip: Secondary | ICD-10-CM | POA: Diagnosis not present

## 2017-10-27 DIAGNOSIS — M25551 Pain in right hip: Secondary | ICD-10-CM | POA: Diagnosis not present

## 2017-10-28 DIAGNOSIS — G4733 Obstructive sleep apnea (adult) (pediatric): Secondary | ICD-10-CM | POA: Diagnosis not present

## 2017-10-28 DIAGNOSIS — D510 Vitamin B12 deficiency anemia due to intrinsic factor deficiency: Secondary | ICD-10-CM | POA: Diagnosis not present

## 2017-11-13 DIAGNOSIS — M25559 Pain in unspecified hip: Secondary | ICD-10-CM | POA: Diagnosis not present

## 2017-11-13 DIAGNOSIS — G894 Chronic pain syndrome: Secondary | ICD-10-CM | POA: Diagnosis not present

## 2017-11-13 DIAGNOSIS — M5417 Radiculopathy, lumbosacral region: Secondary | ICD-10-CM | POA: Diagnosis not present

## 2017-11-19 DIAGNOSIS — H6123 Impacted cerumen, bilateral: Secondary | ICD-10-CM | POA: Diagnosis not present

## 2017-11-19 DIAGNOSIS — H9201 Otalgia, right ear: Secondary | ICD-10-CM | POA: Diagnosis not present

## 2017-11-21 DIAGNOSIS — D510 Vitamin B12 deficiency anemia due to intrinsic factor deficiency: Secondary | ICD-10-CM | POA: Diagnosis not present

## 2017-12-18 DIAGNOSIS — M5417 Radiculopathy, lumbosacral region: Secondary | ICD-10-CM | POA: Diagnosis not present

## 2017-12-18 DIAGNOSIS — M25559 Pain in unspecified hip: Secondary | ICD-10-CM | POA: Diagnosis not present

## 2017-12-18 DIAGNOSIS — G894 Chronic pain syndrome: Secondary | ICD-10-CM | POA: Diagnosis not present

## 2017-12-18 DIAGNOSIS — M25511 Pain in right shoulder: Secondary | ICD-10-CM | POA: Diagnosis not present

## 2017-12-18 DIAGNOSIS — Z79899 Other long term (current) drug therapy: Secondary | ICD-10-CM | POA: Diagnosis not present

## 2017-12-18 DIAGNOSIS — Z79891 Long term (current) use of opiate analgesic: Secondary | ICD-10-CM | POA: Diagnosis not present

## 2017-12-24 DIAGNOSIS — D510 Vitamin B12 deficiency anemia due to intrinsic factor deficiency: Secondary | ICD-10-CM | POA: Diagnosis not present

## 2017-12-29 DIAGNOSIS — J449 Chronic obstructive pulmonary disease, unspecified: Secondary | ICD-10-CM | POA: Diagnosis not present

## 2017-12-29 DIAGNOSIS — G4733 Obstructive sleep apnea (adult) (pediatric): Secondary | ICD-10-CM | POA: Diagnosis not present

## 2018-01-01 DIAGNOSIS — G4733 Obstructive sleep apnea (adult) (pediatric): Secondary | ICD-10-CM | POA: Diagnosis not present

## 2018-01-21 DIAGNOSIS — D510 Vitamin B12 deficiency anemia due to intrinsic factor deficiency: Secondary | ICD-10-CM | POA: Diagnosis not present

## 2018-01-22 DIAGNOSIS — G894 Chronic pain syndrome: Secondary | ICD-10-CM | POA: Diagnosis not present

## 2018-01-22 DIAGNOSIS — M25559 Pain in unspecified hip: Secondary | ICD-10-CM | POA: Diagnosis not present

## 2018-01-22 DIAGNOSIS — M5417 Radiculopathy, lumbosacral region: Secondary | ICD-10-CM | POA: Diagnosis not present

## 2018-02-19 DIAGNOSIS — Z79891 Long term (current) use of opiate analgesic: Secondary | ICD-10-CM | POA: Diagnosis not present

## 2018-02-19 DIAGNOSIS — M25559 Pain in unspecified hip: Secondary | ICD-10-CM | POA: Diagnosis not present

## 2018-02-19 DIAGNOSIS — G894 Chronic pain syndrome: Secondary | ICD-10-CM | POA: Diagnosis not present

## 2018-02-19 DIAGNOSIS — Z79899 Other long term (current) drug therapy: Secondary | ICD-10-CM | POA: Diagnosis not present

## 2018-02-19 DIAGNOSIS — M5417 Radiculopathy, lumbosacral region: Secondary | ICD-10-CM | POA: Diagnosis not present

## 2018-02-27 DIAGNOSIS — I2510 Atherosclerotic heart disease of native coronary artery without angina pectoris: Secondary | ICD-10-CM | POA: Diagnosis not present

## 2018-02-27 DIAGNOSIS — E7849 Other hyperlipidemia: Secondary | ICD-10-CM | POA: Diagnosis not present

## 2018-02-27 DIAGNOSIS — I714 Abdominal aortic aneurysm, without rupture: Secondary | ICD-10-CM | POA: Diagnosis not present

## 2018-02-27 DIAGNOSIS — Z720 Tobacco use: Secondary | ICD-10-CM | POA: Diagnosis not present

## 2018-02-27 DIAGNOSIS — D510 Vitamin B12 deficiency anemia due to intrinsic factor deficiency: Secondary | ICD-10-CM | POA: Diagnosis not present

## 2018-02-27 DIAGNOSIS — I10 Essential (primary) hypertension: Secondary | ICD-10-CM | POA: Diagnosis not present

## 2018-03-12 DIAGNOSIS — G4733 Obstructive sleep apnea (adult) (pediatric): Secondary | ICD-10-CM | POA: Diagnosis not present

## 2018-03-12 DIAGNOSIS — H6123 Impacted cerumen, bilateral: Secondary | ICD-10-CM | POA: Diagnosis not present

## 2018-03-19 DIAGNOSIS — Z79891 Long term (current) use of opiate analgesic: Secondary | ICD-10-CM | POA: Diagnosis not present

## 2018-03-19 DIAGNOSIS — G894 Chronic pain syndrome: Secondary | ICD-10-CM | POA: Diagnosis not present

## 2018-03-19 DIAGNOSIS — Z79899 Other long term (current) drug therapy: Secondary | ICD-10-CM | POA: Diagnosis not present

## 2018-03-19 DIAGNOSIS — M25559 Pain in unspecified hip: Secondary | ICD-10-CM | POA: Diagnosis not present

## 2018-03-19 DIAGNOSIS — M5417 Radiculopathy, lumbosacral region: Secondary | ICD-10-CM | POA: Diagnosis not present

## 2018-03-25 DIAGNOSIS — D510 Vitamin B12 deficiency anemia due to intrinsic factor deficiency: Secondary | ICD-10-CM | POA: Diagnosis not present

## 2018-04-02 DIAGNOSIS — Z79891 Long term (current) use of opiate analgesic: Secondary | ICD-10-CM | POA: Diagnosis not present

## 2018-04-02 DIAGNOSIS — G894 Chronic pain syndrome: Secondary | ICD-10-CM | POA: Diagnosis not present

## 2018-04-02 DIAGNOSIS — Z79899 Other long term (current) drug therapy: Secondary | ICD-10-CM | POA: Diagnosis not present

## 2018-04-02 DIAGNOSIS — M5417 Radiculopathy, lumbosacral region: Secondary | ICD-10-CM | POA: Diagnosis not present

## 2018-04-16 DIAGNOSIS — G894 Chronic pain syndrome: Secondary | ICD-10-CM | POA: Diagnosis not present

## 2018-04-16 DIAGNOSIS — Z79899 Other long term (current) drug therapy: Secondary | ICD-10-CM | POA: Diagnosis not present

## 2018-04-16 DIAGNOSIS — Z79891 Long term (current) use of opiate analgesic: Secondary | ICD-10-CM | POA: Diagnosis not present

## 2018-04-16 DIAGNOSIS — M25559 Pain in unspecified hip: Secondary | ICD-10-CM | POA: Diagnosis not present

## 2018-04-16 DIAGNOSIS — M5417 Radiculopathy, lumbosacral region: Secondary | ICD-10-CM | POA: Diagnosis not present

## 2018-04-23 DIAGNOSIS — D510 Vitamin B12 deficiency anemia due to intrinsic factor deficiency: Secondary | ICD-10-CM | POA: Diagnosis not present

## 2018-04-30 DIAGNOSIS — M5417 Radiculopathy, lumbosacral region: Secondary | ICD-10-CM | POA: Diagnosis not present

## 2018-04-30 DIAGNOSIS — G894 Chronic pain syndrome: Secondary | ICD-10-CM | POA: Diagnosis not present

## 2018-04-30 DIAGNOSIS — Z79899 Other long term (current) drug therapy: Secondary | ICD-10-CM | POA: Diagnosis not present

## 2018-04-30 DIAGNOSIS — M25559 Pain in unspecified hip: Secondary | ICD-10-CM | POA: Diagnosis not present

## 2018-04-30 DIAGNOSIS — Z79891 Long term (current) use of opiate analgesic: Secondary | ICD-10-CM | POA: Diagnosis not present

## 2018-04-30 DIAGNOSIS — M25511 Pain in right shoulder: Secondary | ICD-10-CM | POA: Diagnosis not present

## 2018-05-12 DIAGNOSIS — M542 Cervicalgia: Secondary | ICD-10-CM | POA: Diagnosis not present

## 2018-05-20 DIAGNOSIS — G894 Chronic pain syndrome: Secondary | ICD-10-CM | POA: Diagnosis not present

## 2018-05-20 DIAGNOSIS — M5417 Radiculopathy, lumbosacral region: Secondary | ICD-10-CM | POA: Diagnosis not present

## 2018-05-20 DIAGNOSIS — Z79891 Long term (current) use of opiate analgesic: Secondary | ICD-10-CM | POA: Diagnosis not present

## 2018-05-20 DIAGNOSIS — M25511 Pain in right shoulder: Secondary | ICD-10-CM | POA: Diagnosis not present

## 2018-05-20 DIAGNOSIS — M25559 Pain in unspecified hip: Secondary | ICD-10-CM | POA: Diagnosis not present

## 2018-05-20 DIAGNOSIS — Z79899 Other long term (current) drug therapy: Secondary | ICD-10-CM | POA: Diagnosis not present

## 2018-05-27 DIAGNOSIS — D510 Vitamin B12 deficiency anemia due to intrinsic factor deficiency: Secondary | ICD-10-CM | POA: Diagnosis not present

## 2018-06-17 DIAGNOSIS — G4733 Obstructive sleep apnea (adult) (pediatric): Secondary | ICD-10-CM | POA: Diagnosis not present

## 2018-06-18 DIAGNOSIS — G894 Chronic pain syndrome: Secondary | ICD-10-CM | POA: Diagnosis not present

## 2018-06-18 DIAGNOSIS — Z79891 Long term (current) use of opiate analgesic: Secondary | ICD-10-CM | POA: Diagnosis not present

## 2018-06-18 DIAGNOSIS — M25559 Pain in unspecified hip: Secondary | ICD-10-CM | POA: Diagnosis not present

## 2018-06-18 DIAGNOSIS — Z79899 Other long term (current) drug therapy: Secondary | ICD-10-CM | POA: Diagnosis not present

## 2018-06-18 DIAGNOSIS — M5417 Radiculopathy, lumbosacral region: Secondary | ICD-10-CM | POA: Diagnosis not present

## 2018-06-24 DIAGNOSIS — D510 Vitamin B12 deficiency anemia due to intrinsic factor deficiency: Secondary | ICD-10-CM | POA: Diagnosis not present

## 2018-07-16 DIAGNOSIS — G894 Chronic pain syndrome: Secondary | ICD-10-CM | POA: Diagnosis not present

## 2018-07-16 DIAGNOSIS — Z79891 Long term (current) use of opiate analgesic: Secondary | ICD-10-CM | POA: Diagnosis not present

## 2018-07-16 DIAGNOSIS — M25511 Pain in right shoulder: Secondary | ICD-10-CM | POA: Diagnosis not present

## 2018-07-16 DIAGNOSIS — M25559 Pain in unspecified hip: Secondary | ICD-10-CM | POA: Diagnosis not present

## 2018-07-16 DIAGNOSIS — Z79899 Other long term (current) drug therapy: Secondary | ICD-10-CM | POA: Diagnosis not present

## 2018-07-16 DIAGNOSIS — M5417 Radiculopathy, lumbosacral region: Secondary | ICD-10-CM | POA: Diagnosis not present

## 2018-07-20 DIAGNOSIS — H539 Unspecified visual disturbance: Secondary | ICD-10-CM | POA: Diagnosis not present

## 2018-07-21 DIAGNOSIS — H33312 Horseshoe tear of retina without detachment, left eye: Secondary | ICD-10-CM | POA: Diagnosis not present

## 2018-07-23 DIAGNOSIS — H43811 Vitreous degeneration, right eye: Secondary | ICD-10-CM | POA: Diagnosis not present

## 2018-07-23 DIAGNOSIS — H4312 Vitreous hemorrhage, left eye: Secondary | ICD-10-CM | POA: Diagnosis not present

## 2018-07-23 DIAGNOSIS — H33312 Horseshoe tear of retina without detachment, left eye: Secondary | ICD-10-CM | POA: Diagnosis not present

## 2018-07-27 DIAGNOSIS — H6121 Impacted cerumen, right ear: Secondary | ICD-10-CM | POA: Diagnosis not present

## 2018-07-27 DIAGNOSIS — H938X2 Other specified disorders of left ear: Secondary | ICD-10-CM | POA: Diagnosis not present

## 2018-07-28 DIAGNOSIS — Z79899 Other long term (current) drug therapy: Secondary | ICD-10-CM | POA: Diagnosis not present

## 2018-07-28 DIAGNOSIS — Z23 Encounter for immunization: Secondary | ICD-10-CM | POA: Diagnosis not present

## 2018-07-28 DIAGNOSIS — Z720 Tobacco use: Secondary | ICD-10-CM | POA: Diagnosis not present

## 2018-07-28 DIAGNOSIS — R5383 Other fatigue: Secondary | ICD-10-CM | POA: Diagnosis not present

## 2018-07-28 DIAGNOSIS — E785 Hyperlipidemia, unspecified: Secondary | ICD-10-CM | POA: Diagnosis not present

## 2018-07-28 DIAGNOSIS — I10 Essential (primary) hypertension: Secondary | ICD-10-CM | POA: Diagnosis not present

## 2018-08-10 DIAGNOSIS — G4733 Obstructive sleep apnea (adult) (pediatric): Secondary | ICD-10-CM | POA: Diagnosis not present

## 2018-08-10 DIAGNOSIS — J449 Chronic obstructive pulmonary disease, unspecified: Secondary | ICD-10-CM | POA: Diagnosis not present

## 2018-08-13 DIAGNOSIS — M25511 Pain in right shoulder: Secondary | ICD-10-CM | POA: Diagnosis not present

## 2018-08-13 DIAGNOSIS — M5417 Radiculopathy, lumbosacral region: Secondary | ICD-10-CM | POA: Diagnosis not present

## 2018-08-13 DIAGNOSIS — M25559 Pain in unspecified hip: Secondary | ICD-10-CM | POA: Diagnosis not present

## 2018-08-13 DIAGNOSIS — G894 Chronic pain syndrome: Secondary | ICD-10-CM | POA: Diagnosis not present

## 2018-08-28 DIAGNOSIS — D510 Vitamin B12 deficiency anemia due to intrinsic factor deficiency: Secondary | ICD-10-CM | POA: Diagnosis not present

## 2018-09-04 DIAGNOSIS — I10 Essential (primary) hypertension: Secondary | ICD-10-CM | POA: Diagnosis not present

## 2018-09-04 DIAGNOSIS — Z720 Tobacco use: Secondary | ICD-10-CM | POA: Diagnosis not present

## 2018-09-04 DIAGNOSIS — M7701 Medial epicondylitis, right elbow: Secondary | ICD-10-CM | POA: Diagnosis not present

## 2018-09-04 DIAGNOSIS — I2510 Atherosclerotic heart disease of native coronary artery without angina pectoris: Secondary | ICD-10-CM | POA: Diagnosis not present

## 2018-09-04 DIAGNOSIS — I714 Abdominal aortic aneurysm, without rupture: Secondary | ICD-10-CM | POA: Diagnosis not present

## 2018-09-04 DIAGNOSIS — E785 Hyperlipidemia, unspecified: Secondary | ICD-10-CM | POA: Diagnosis not present

## 2018-09-10 DIAGNOSIS — G894 Chronic pain syndrome: Secondary | ICD-10-CM | POA: Diagnosis not present

## 2018-09-10 DIAGNOSIS — Z79891 Long term (current) use of opiate analgesic: Secondary | ICD-10-CM | POA: Diagnosis not present

## 2018-09-10 DIAGNOSIS — M25559 Pain in unspecified hip: Secondary | ICD-10-CM | POA: Diagnosis not present

## 2018-09-10 DIAGNOSIS — Z79899 Other long term (current) drug therapy: Secondary | ICD-10-CM | POA: Diagnosis not present

## 2018-09-10 DIAGNOSIS — M545 Low back pain: Secondary | ICD-10-CM | POA: Diagnosis not present

## 2018-09-10 DIAGNOSIS — M5417 Radiculopathy, lumbosacral region: Secondary | ICD-10-CM | POA: Diagnosis not present

## 2018-09-15 DIAGNOSIS — G4733 Obstructive sleep apnea (adult) (pediatric): Secondary | ICD-10-CM | POA: Diagnosis not present

## 2018-09-21 DIAGNOSIS — M961 Postlaminectomy syndrome, not elsewhere classified: Secondary | ICD-10-CM | POA: Diagnosis not present

## 2018-09-23 DIAGNOSIS — E538 Deficiency of other specified B group vitamins: Secondary | ICD-10-CM | POA: Diagnosis not present

## 2018-10-08 DIAGNOSIS — M25559 Pain in unspecified hip: Secondary | ICD-10-CM | POA: Diagnosis not present

## 2018-10-08 DIAGNOSIS — G894 Chronic pain syndrome: Secondary | ICD-10-CM | POA: Diagnosis not present

## 2018-10-08 DIAGNOSIS — M25511 Pain in right shoulder: Secondary | ICD-10-CM | POA: Diagnosis not present

## 2018-10-08 DIAGNOSIS — M5417 Radiculopathy, lumbosacral region: Secondary | ICD-10-CM | POA: Diagnosis not present

## 2018-10-26 DIAGNOSIS — D510 Vitamin B12 deficiency anemia due to intrinsic factor deficiency: Secondary | ICD-10-CM | POA: Diagnosis not present

## 2018-11-04 DIAGNOSIS — Z0000 Encounter for general adult medical examination without abnormal findings: Secondary | ICD-10-CM | POA: Diagnosis not present

## 2018-11-04 DIAGNOSIS — Z9181 History of falling: Secondary | ICD-10-CM | POA: Diagnosis not present

## 2018-11-05 DIAGNOSIS — M25559 Pain in unspecified hip: Secondary | ICD-10-CM | POA: Diagnosis not present

## 2018-11-05 DIAGNOSIS — M25511 Pain in right shoulder: Secondary | ICD-10-CM | POA: Diagnosis not present

## 2018-11-05 DIAGNOSIS — G894 Chronic pain syndrome: Secondary | ICD-10-CM | POA: Diagnosis not present

## 2018-11-05 DIAGNOSIS — Z79891 Long term (current) use of opiate analgesic: Secondary | ICD-10-CM | POA: Diagnosis not present

## 2018-11-05 DIAGNOSIS — M5417 Radiculopathy, lumbosacral region: Secondary | ICD-10-CM | POA: Diagnosis not present

## 2018-11-05 DIAGNOSIS — Z79899 Other long term (current) drug therapy: Secondary | ICD-10-CM | POA: Diagnosis not present

## 2018-11-30 DIAGNOSIS — G4733 Obstructive sleep apnea (adult) (pediatric): Secondary | ICD-10-CM | POA: Diagnosis not present

## 2018-11-30 DIAGNOSIS — J449 Chronic obstructive pulmonary disease, unspecified: Secondary | ICD-10-CM | POA: Diagnosis not present

## 2018-12-10 DIAGNOSIS — M25559 Pain in unspecified hip: Secondary | ICD-10-CM | POA: Diagnosis not present

## 2018-12-10 DIAGNOSIS — M5417 Radiculopathy, lumbosacral region: Secondary | ICD-10-CM | POA: Diagnosis not present

## 2018-12-10 DIAGNOSIS — M25511 Pain in right shoulder: Secondary | ICD-10-CM | POA: Diagnosis not present

## 2018-12-10 DIAGNOSIS — G894 Chronic pain syndrome: Secondary | ICD-10-CM | POA: Diagnosis not present

## 2018-12-16 DIAGNOSIS — G4733 Obstructive sleep apnea (adult) (pediatric): Secondary | ICD-10-CM | POA: Diagnosis not present

## 2018-12-16 DIAGNOSIS — H6121 Impacted cerumen, right ear: Secondary | ICD-10-CM | POA: Diagnosis not present

## 2018-12-23 DIAGNOSIS — E538 Deficiency of other specified B group vitamins: Secondary | ICD-10-CM | POA: Diagnosis not present

## 2019-01-07 DIAGNOSIS — M25511 Pain in right shoulder: Secondary | ICD-10-CM | POA: Diagnosis not present

## 2019-01-07 DIAGNOSIS — M5417 Radiculopathy, lumbosacral region: Secondary | ICD-10-CM | POA: Diagnosis not present

## 2019-01-07 DIAGNOSIS — G894 Chronic pain syndrome: Secondary | ICD-10-CM | POA: Diagnosis not present

## 2019-01-07 DIAGNOSIS — M25559 Pain in unspecified hip: Secondary | ICD-10-CM | POA: Diagnosis not present

## 2019-01-12 DIAGNOSIS — E785 Hyperlipidemia, unspecified: Secondary | ICD-10-CM | POA: Diagnosis not present

## 2019-01-12 DIAGNOSIS — I10 Essential (primary) hypertension: Secondary | ICD-10-CM | POA: Diagnosis not present

## 2019-01-20 DIAGNOSIS — D519 Vitamin B12 deficiency anemia, unspecified: Secondary | ICD-10-CM | POA: Diagnosis not present

## 2019-02-05 DIAGNOSIS — M25559 Pain in unspecified hip: Secondary | ICD-10-CM | POA: Diagnosis not present

## 2019-02-05 DIAGNOSIS — M25511 Pain in right shoulder: Secondary | ICD-10-CM | POA: Diagnosis not present

## 2019-02-05 DIAGNOSIS — Z79899 Other long term (current) drug therapy: Secondary | ICD-10-CM | POA: Diagnosis not present

## 2019-02-05 DIAGNOSIS — M5417 Radiculopathy, lumbosacral region: Secondary | ICD-10-CM | POA: Diagnosis not present

## 2019-02-05 DIAGNOSIS — Z79891 Long term (current) use of opiate analgesic: Secondary | ICD-10-CM | POA: Diagnosis not present

## 2019-02-05 DIAGNOSIS — G894 Chronic pain syndrome: Secondary | ICD-10-CM | POA: Diagnosis not present

## 2019-02-12 DIAGNOSIS — E782 Mixed hyperlipidemia: Secondary | ICD-10-CM | POA: Diagnosis not present

## 2019-02-12 DIAGNOSIS — D519 Vitamin B12 deficiency anemia, unspecified: Secondary | ICD-10-CM | POA: Diagnosis not present

## 2019-02-26 DIAGNOSIS — D519 Vitamin B12 deficiency anemia, unspecified: Secondary | ICD-10-CM | POA: Diagnosis not present

## 2019-03-04 DIAGNOSIS — M25511 Pain in right shoulder: Secondary | ICD-10-CM | POA: Diagnosis not present

## 2019-03-04 DIAGNOSIS — G894 Chronic pain syndrome: Secondary | ICD-10-CM | POA: Diagnosis not present

## 2019-03-04 DIAGNOSIS — M25559 Pain in unspecified hip: Secondary | ICD-10-CM | POA: Diagnosis not present

## 2019-03-04 DIAGNOSIS — M5417 Radiculopathy, lumbosacral region: Secondary | ICD-10-CM | POA: Diagnosis not present

## 2019-03-15 DIAGNOSIS — I10 Essential (primary) hypertension: Secondary | ICD-10-CM | POA: Diagnosis not present

## 2019-03-15 DIAGNOSIS — J449 Chronic obstructive pulmonary disease, unspecified: Secondary | ICD-10-CM | POA: Diagnosis not present

## 2019-03-15 DIAGNOSIS — E785 Hyperlipidemia, unspecified: Secondary | ICD-10-CM | POA: Diagnosis not present

## 2019-03-17 DIAGNOSIS — G4733 Obstructive sleep apnea (adult) (pediatric): Secondary | ICD-10-CM | POA: Diagnosis not present

## 2019-03-24 DIAGNOSIS — D519 Vitamin B12 deficiency anemia, unspecified: Secondary | ICD-10-CM | POA: Diagnosis not present

## 2019-04-02 DIAGNOSIS — L03115 Cellulitis of right lower limb: Secondary | ICD-10-CM | POA: Diagnosis not present

## 2019-04-04 DIAGNOSIS — L0390 Cellulitis, unspecified: Secondary | ICD-10-CM | POA: Diagnosis not present

## 2019-04-08 DIAGNOSIS — M25511 Pain in right shoulder: Secondary | ICD-10-CM | POA: Diagnosis not present

## 2019-04-08 DIAGNOSIS — M5417 Radiculopathy, lumbosacral region: Secondary | ICD-10-CM | POA: Diagnosis not present

## 2019-04-08 DIAGNOSIS — M25559 Pain in unspecified hip: Secondary | ICD-10-CM | POA: Diagnosis not present

## 2019-04-08 DIAGNOSIS — G894 Chronic pain syndrome: Secondary | ICD-10-CM | POA: Diagnosis not present

## 2019-04-27 DIAGNOSIS — Z79899 Other long term (current) drug therapy: Secondary | ICD-10-CM | POA: Diagnosis not present

## 2019-04-27 DIAGNOSIS — M949 Disorder of cartilage, unspecified: Secondary | ICD-10-CM | POA: Diagnosis not present

## 2019-04-27 DIAGNOSIS — E538 Deficiency of other specified B group vitamins: Secondary | ICD-10-CM | POA: Diagnosis not present

## 2019-04-27 DIAGNOSIS — E785 Hyperlipidemia, unspecified: Secondary | ICD-10-CM | POA: Diagnosis not present

## 2019-04-29 DIAGNOSIS — H4312 Vitreous hemorrhage, left eye: Secondary | ICD-10-CM | POA: Diagnosis not present

## 2019-05-06 DIAGNOSIS — Z79891 Long term (current) use of opiate analgesic: Secondary | ICD-10-CM | POA: Diagnosis not present

## 2019-05-06 DIAGNOSIS — M25559 Pain in unspecified hip: Secondary | ICD-10-CM | POA: Diagnosis not present

## 2019-05-06 DIAGNOSIS — G894 Chronic pain syndrome: Secondary | ICD-10-CM | POA: Diagnosis not present

## 2019-05-06 DIAGNOSIS — Z79899 Other long term (current) drug therapy: Secondary | ICD-10-CM | POA: Diagnosis not present

## 2019-05-06 DIAGNOSIS — M25511 Pain in right shoulder: Secondary | ICD-10-CM | POA: Diagnosis not present

## 2019-05-06 DIAGNOSIS — M5417 Radiculopathy, lumbosacral region: Secondary | ICD-10-CM | POA: Diagnosis not present

## 2019-05-13 DIAGNOSIS — M7712 Lateral epicondylitis, left elbow: Secondary | ICD-10-CM | POA: Diagnosis not present

## 2019-05-14 DIAGNOSIS — J449 Chronic obstructive pulmonary disease, unspecified: Secondary | ICD-10-CM | POA: Diagnosis not present

## 2019-05-14 DIAGNOSIS — I259 Chronic ischemic heart disease, unspecified: Secondary | ICD-10-CM | POA: Diagnosis not present

## 2019-05-14 DIAGNOSIS — I10 Essential (primary) hypertension: Secondary | ICD-10-CM | POA: Diagnosis not present

## 2019-05-16 DIAGNOSIS — S46912A Strain of unspecified muscle, fascia and tendon at shoulder and upper arm level, left arm, initial encounter: Secondary | ICD-10-CM | POA: Diagnosis not present

## 2019-05-31 DIAGNOSIS — H33032 Retinal detachment with giant retinal tear, left eye: Secondary | ICD-10-CM | POA: Diagnosis not present

## 2019-05-31 DIAGNOSIS — Z01818 Encounter for other preprocedural examination: Secondary | ICD-10-CM | POA: Diagnosis not present

## 2019-05-31 DIAGNOSIS — H33332 Multiple defects of retina without detachment, left eye: Secondary | ICD-10-CM | POA: Diagnosis not present

## 2019-05-31 DIAGNOSIS — H43392 Other vitreous opacities, left eye: Secondary | ICD-10-CM | POA: Diagnosis not present

## 2019-05-31 DIAGNOSIS — H2512 Age-related nuclear cataract, left eye: Secondary | ICD-10-CM | POA: Diagnosis not present

## 2019-05-31 DIAGNOSIS — H4312 Vitreous hemorrhage, left eye: Secondary | ICD-10-CM | POA: Diagnosis not present

## 2019-06-03 DIAGNOSIS — M25559 Pain in unspecified hip: Secondary | ICD-10-CM | POA: Diagnosis not present

## 2019-06-03 DIAGNOSIS — M5417 Radiculopathy, lumbosacral region: Secondary | ICD-10-CM | POA: Diagnosis not present

## 2019-06-03 DIAGNOSIS — G894 Chronic pain syndrome: Secondary | ICD-10-CM | POA: Diagnosis not present

## 2019-06-03 DIAGNOSIS — M25511 Pain in right shoulder: Secondary | ICD-10-CM | POA: Diagnosis not present

## 2019-06-04 DIAGNOSIS — Z720 Tobacco use: Secondary | ICD-10-CM | POA: Diagnosis not present

## 2019-06-04 DIAGNOSIS — I2510 Atherosclerotic heart disease of native coronary artery without angina pectoris: Secondary | ICD-10-CM | POA: Diagnosis not present

## 2019-06-04 DIAGNOSIS — E785 Hyperlipidemia, unspecified: Secondary | ICD-10-CM | POA: Diagnosis not present

## 2019-06-04 DIAGNOSIS — I714 Abdominal aortic aneurysm, without rupture: Secondary | ICD-10-CM | POA: Diagnosis not present

## 2019-06-04 DIAGNOSIS — I10 Essential (primary) hypertension: Secondary | ICD-10-CM | POA: Diagnosis not present

## 2019-06-14 DIAGNOSIS — R0989 Other specified symptoms and signs involving the circulatory and respiratory systems: Secondary | ICD-10-CM | POA: Diagnosis not present

## 2019-06-14 DIAGNOSIS — I10 Essential (primary) hypertension: Secondary | ICD-10-CM | POA: Diagnosis not present

## 2019-06-14 DIAGNOSIS — D519 Vitamin B12 deficiency anemia, unspecified: Secondary | ICD-10-CM | POA: Diagnosis not present

## 2019-06-14 DIAGNOSIS — E7800 Pure hypercholesterolemia, unspecified: Secondary | ICD-10-CM | POA: Diagnosis not present

## 2019-06-18 DIAGNOSIS — G4733 Obstructive sleep apnea (adult) (pediatric): Secondary | ICD-10-CM | POA: Diagnosis not present

## 2019-06-23 DIAGNOSIS — E538 Deficiency of other specified B group vitamins: Secondary | ICD-10-CM | POA: Diagnosis not present

## 2019-07-01 DIAGNOSIS — M5417 Radiculopathy, lumbosacral region: Secondary | ICD-10-CM | POA: Diagnosis not present

## 2019-07-01 DIAGNOSIS — M25511 Pain in right shoulder: Secondary | ICD-10-CM | POA: Diagnosis not present

## 2019-07-01 DIAGNOSIS — G894 Chronic pain syndrome: Secondary | ICD-10-CM | POA: Diagnosis not present

## 2019-07-01 DIAGNOSIS — M25559 Pain in unspecified hip: Secondary | ICD-10-CM | POA: Diagnosis not present

## 2019-07-15 DIAGNOSIS — D519 Vitamin B12 deficiency anemia, unspecified: Secondary | ICD-10-CM | POA: Diagnosis not present

## 2019-07-15 DIAGNOSIS — I10 Essential (primary) hypertension: Secondary | ICD-10-CM | POA: Diagnosis not present

## 2019-07-15 DIAGNOSIS — E7800 Pure hypercholesterolemia, unspecified: Secondary | ICD-10-CM | POA: Diagnosis not present

## 2019-07-29 DIAGNOSIS — E538 Deficiency of other specified B group vitamins: Secondary | ICD-10-CM | POA: Diagnosis not present

## 2019-08-05 DIAGNOSIS — Z79891 Long term (current) use of opiate analgesic: Secondary | ICD-10-CM | POA: Diagnosis not present

## 2019-08-05 DIAGNOSIS — Z79899 Other long term (current) drug therapy: Secondary | ICD-10-CM | POA: Diagnosis not present

## 2019-08-05 DIAGNOSIS — G894 Chronic pain syndrome: Secondary | ICD-10-CM | POA: Diagnosis not present

## 2019-08-05 DIAGNOSIS — M25511 Pain in right shoulder: Secondary | ICD-10-CM | POA: Diagnosis not present

## 2019-08-05 DIAGNOSIS — M25559 Pain in unspecified hip: Secondary | ICD-10-CM | POA: Diagnosis not present

## 2019-08-05 DIAGNOSIS — M5417 Radiculopathy, lumbosacral region: Secondary | ICD-10-CM | POA: Diagnosis not present

## 2019-08-14 DIAGNOSIS — D519 Vitamin B12 deficiency anemia, unspecified: Secondary | ICD-10-CM | POA: Diagnosis not present

## 2019-08-14 DIAGNOSIS — E7800 Pure hypercholesterolemia, unspecified: Secondary | ICD-10-CM | POA: Diagnosis not present

## 2019-08-14 DIAGNOSIS — I10 Essential (primary) hypertension: Secondary | ICD-10-CM | POA: Diagnosis not present

## 2019-08-23 DIAGNOSIS — I10 Essential (primary) hypertension: Secondary | ICD-10-CM | POA: Diagnosis not present

## 2019-08-23 DIAGNOSIS — E785 Hyperlipidemia, unspecified: Secondary | ICD-10-CM

## 2019-08-23 DIAGNOSIS — G8929 Other chronic pain: Secondary | ICD-10-CM | POA: Diagnosis not present

## 2019-08-23 DIAGNOSIS — I4510 Unspecified right bundle-branch block: Secondary | ICD-10-CM | POA: Diagnosis not present

## 2019-08-23 DIAGNOSIS — R55 Syncope and collapse: Secondary | ICD-10-CM | POA: Diagnosis not present

## 2019-08-23 DIAGNOSIS — M549 Dorsalgia, unspecified: Secondary | ICD-10-CM | POA: Diagnosis not present

## 2019-08-23 DIAGNOSIS — I2510 Atherosclerotic heart disease of native coronary artery without angina pectoris: Secondary | ICD-10-CM | POA: Diagnosis not present

## 2019-08-23 DIAGNOSIS — Z79899 Other long term (current) drug therapy: Secondary | ICD-10-CM | POA: Diagnosis not present

## 2019-08-23 DIAGNOSIS — Z7982 Long term (current) use of aspirin: Secondary | ICD-10-CM | POA: Diagnosis not present

## 2019-08-23 DIAGNOSIS — N179 Acute kidney failure, unspecified: Secondary | ICD-10-CM | POA: Diagnosis not present

## 2019-08-23 DIAGNOSIS — F17210 Nicotine dependence, cigarettes, uncomplicated: Secondary | ICD-10-CM | POA: Diagnosis not present

## 2019-08-23 DIAGNOSIS — I714 Abdominal aortic aneurysm, without rupture: Secondary | ICD-10-CM

## 2019-08-23 DIAGNOSIS — R0789 Other chest pain: Secondary | ICD-10-CM | POA: Diagnosis not present

## 2019-08-23 DIAGNOSIS — I712 Thoracic aortic aneurysm, without rupture: Secondary | ICD-10-CM | POA: Diagnosis not present

## 2019-08-23 DIAGNOSIS — Z7902 Long term (current) use of antithrombotics/antiplatelets: Secondary | ICD-10-CM | POA: Diagnosis not present

## 2019-08-23 DIAGNOSIS — R079 Chest pain, unspecified: Secondary | ICD-10-CM | POA: Diagnosis not present

## 2019-08-23 DIAGNOSIS — Z720 Tobacco use: Secondary | ICD-10-CM | POA: Diagnosis not present

## 2019-08-23 DIAGNOSIS — Z79891 Long term (current) use of opiate analgesic: Secondary | ICD-10-CM | POA: Diagnosis not present

## 2019-08-24 DIAGNOSIS — I10 Essential (primary) hypertension: Secondary | ICD-10-CM | POA: Diagnosis not present

## 2019-08-24 DIAGNOSIS — E785 Hyperlipidemia, unspecified: Secondary | ICD-10-CM | POA: Diagnosis not present

## 2019-08-24 DIAGNOSIS — R079 Chest pain, unspecified: Secondary | ICD-10-CM | POA: Diagnosis not present

## 2019-08-24 DIAGNOSIS — I714 Abdominal aortic aneurysm, without rupture: Secondary | ICD-10-CM | POA: Diagnosis not present

## 2019-08-24 DIAGNOSIS — I4510 Unspecified right bundle-branch block: Secondary | ICD-10-CM | POA: Diagnosis not present

## 2019-08-24 DIAGNOSIS — I2510 Atherosclerotic heart disease of native coronary artery without angina pectoris: Secondary | ICD-10-CM | POA: Diagnosis not present

## 2019-08-24 DIAGNOSIS — Z720 Tobacco use: Secondary | ICD-10-CM | POA: Diagnosis not present

## 2019-09-06 DIAGNOSIS — I25119 Atherosclerotic heart disease of native coronary artery with unspecified angina pectoris: Secondary | ICD-10-CM | POA: Diagnosis not present

## 2019-09-06 DIAGNOSIS — I714 Abdominal aortic aneurysm, without rupture: Secondary | ICD-10-CM | POA: Diagnosis not present

## 2019-09-06 DIAGNOSIS — I712 Thoracic aortic aneurysm, without rupture: Secondary | ICD-10-CM | POA: Diagnosis not present

## 2019-09-06 DIAGNOSIS — I259 Chronic ischemic heart disease, unspecified: Secondary | ICD-10-CM | POA: Diagnosis not present

## 2019-09-06 DIAGNOSIS — F17210 Nicotine dependence, cigarettes, uncomplicated: Secondary | ICD-10-CM | POA: Diagnosis not present

## 2019-09-06 DIAGNOSIS — F17200 Nicotine dependence, unspecified, uncomplicated: Secondary | ICD-10-CM | POA: Diagnosis not present

## 2019-09-09 DIAGNOSIS — M25559 Pain in unspecified hip: Secondary | ICD-10-CM | POA: Diagnosis not present

## 2019-09-09 DIAGNOSIS — G894 Chronic pain syndrome: Secondary | ICD-10-CM | POA: Diagnosis not present

## 2019-09-09 DIAGNOSIS — M25511 Pain in right shoulder: Secondary | ICD-10-CM | POA: Diagnosis not present

## 2019-09-09 DIAGNOSIS — M5417 Radiculopathy, lumbosacral region: Secondary | ICD-10-CM | POA: Diagnosis not present

## 2019-09-12 DIAGNOSIS — G4700 Insomnia, unspecified: Secondary | ICD-10-CM | POA: Diagnosis not present

## 2019-09-12 DIAGNOSIS — I10 Essential (primary) hypertension: Secondary | ICD-10-CM | POA: Diagnosis not present

## 2019-09-16 DIAGNOSIS — G4733 Obstructive sleep apnea (adult) (pediatric): Secondary | ICD-10-CM | POA: Diagnosis not present

## 2019-09-28 DIAGNOSIS — H6123 Impacted cerumen, bilateral: Secondary | ICD-10-CM | POA: Diagnosis not present

## 2019-09-29 DIAGNOSIS — J449 Chronic obstructive pulmonary disease, unspecified: Secondary | ICD-10-CM | POA: Diagnosis not present

## 2019-09-29 DIAGNOSIS — I712 Thoracic aortic aneurysm, without rupture: Secondary | ICD-10-CM | POA: Diagnosis not present

## 2019-09-29 DIAGNOSIS — I25118 Atherosclerotic heart disease of native coronary artery with other forms of angina pectoris: Secondary | ICD-10-CM | POA: Diagnosis not present

## 2019-09-29 DIAGNOSIS — R0789 Other chest pain: Secondary | ICD-10-CM | POA: Diagnosis not present

## 2019-09-29 DIAGNOSIS — I2510 Atherosclerotic heart disease of native coronary artery without angina pectoris: Secondary | ICD-10-CM | POA: Diagnosis not present

## 2019-09-29 DIAGNOSIS — Z955 Presence of coronary angioplasty implant and graft: Secondary | ICD-10-CM | POA: Diagnosis not present

## 2019-09-29 DIAGNOSIS — I10 Essential (primary) hypertension: Secondary | ICD-10-CM | POA: Diagnosis not present

## 2019-09-29 DIAGNOSIS — E785 Hyperlipidemia, unspecified: Secondary | ICD-10-CM | POA: Diagnosis not present

## 2019-10-07 DIAGNOSIS — M5417 Radiculopathy, lumbosacral region: Secondary | ICD-10-CM | POA: Diagnosis not present

## 2019-10-07 DIAGNOSIS — M25511 Pain in right shoulder: Secondary | ICD-10-CM | POA: Diagnosis not present

## 2019-10-07 DIAGNOSIS — M25559 Pain in unspecified hip: Secondary | ICD-10-CM | POA: Diagnosis not present

## 2019-10-07 DIAGNOSIS — G894 Chronic pain syndrome: Secondary | ICD-10-CM | POA: Diagnosis not present

## 2019-10-11 DIAGNOSIS — R0789 Other chest pain: Secondary | ICD-10-CM | POA: Diagnosis not present

## 2019-10-11 DIAGNOSIS — I2510 Atherosclerotic heart disease of native coronary artery without angina pectoris: Secondary | ICD-10-CM | POA: Diagnosis not present

## 2019-10-11 DIAGNOSIS — I25118 Atherosclerotic heart disease of native coronary artery with other forms of angina pectoris: Secondary | ICD-10-CM | POA: Diagnosis not present

## 2019-10-11 DIAGNOSIS — I10 Essential (primary) hypertension: Secondary | ICD-10-CM | POA: Diagnosis not present

## 2019-10-11 DIAGNOSIS — Z9861 Coronary angioplasty status: Secondary | ICD-10-CM | POA: Diagnosis not present

## 2019-10-11 DIAGNOSIS — E785 Hyperlipidemia, unspecified: Secondary | ICD-10-CM | POA: Diagnosis not present

## 2019-10-11 DIAGNOSIS — I712 Thoracic aortic aneurysm, without rupture: Secondary | ICD-10-CM | POA: Diagnosis not present

## 2019-10-13 DIAGNOSIS — E7800 Pure hypercholesterolemia, unspecified: Secondary | ICD-10-CM | POA: Diagnosis not present

## 2019-10-13 DIAGNOSIS — D519 Vitamin B12 deficiency anemia, unspecified: Secondary | ICD-10-CM | POA: Diagnosis not present

## 2019-10-18 DIAGNOSIS — H25813 Combined forms of age-related cataract, bilateral: Secondary | ICD-10-CM | POA: Diagnosis not present

## 2019-11-04 DIAGNOSIS — G894 Chronic pain syndrome: Secondary | ICD-10-CM | POA: Diagnosis not present

## 2019-11-04 DIAGNOSIS — Z79899 Other long term (current) drug therapy: Secondary | ICD-10-CM | POA: Diagnosis not present

## 2019-11-04 DIAGNOSIS — Z79891 Long term (current) use of opiate analgesic: Secondary | ICD-10-CM | POA: Diagnosis not present

## 2019-11-04 DIAGNOSIS — M25559 Pain in unspecified hip: Secondary | ICD-10-CM | POA: Diagnosis not present

## 2019-11-04 DIAGNOSIS — M5417 Radiculopathy, lumbosacral region: Secondary | ICD-10-CM | POA: Diagnosis not present

## 2019-11-04 DIAGNOSIS — M25511 Pain in right shoulder: Secondary | ICD-10-CM | POA: Diagnosis not present

## 2019-11-05 DIAGNOSIS — Z720 Tobacco use: Secondary | ICD-10-CM | POA: Diagnosis not present

## 2019-11-05 DIAGNOSIS — I712 Thoracic aortic aneurysm, without rupture: Secondary | ICD-10-CM | POA: Diagnosis not present

## 2019-11-05 DIAGNOSIS — I2510 Atherosclerotic heart disease of native coronary artery without angina pectoris: Secondary | ICD-10-CM | POA: Diagnosis not present

## 2019-11-05 DIAGNOSIS — E785 Hyperlipidemia, unspecified: Secondary | ICD-10-CM | POA: Diagnosis not present

## 2019-11-05 DIAGNOSIS — I10 Essential (primary) hypertension: Secondary | ICD-10-CM | POA: Diagnosis not present

## 2019-11-10 DIAGNOSIS — I77811 Abdominal aortic ectasia: Secondary | ICD-10-CM | POA: Diagnosis not present

## 2019-11-12 DIAGNOSIS — J449 Chronic obstructive pulmonary disease, unspecified: Secondary | ICD-10-CM | POA: Diagnosis not present

## 2019-11-12 DIAGNOSIS — E782 Mixed hyperlipidemia: Secondary | ICD-10-CM | POA: Diagnosis not present

## 2019-11-12 DIAGNOSIS — I10 Essential (primary) hypertension: Secondary | ICD-10-CM | POA: Diagnosis not present

## 2019-12-02 DIAGNOSIS — G894 Chronic pain syndrome: Secondary | ICD-10-CM | POA: Diagnosis not present

## 2019-12-02 DIAGNOSIS — M25511 Pain in right shoulder: Secondary | ICD-10-CM | POA: Diagnosis not present

## 2019-12-02 DIAGNOSIS — M5417 Radiculopathy, lumbosacral region: Secondary | ICD-10-CM | POA: Diagnosis not present

## 2019-12-02 DIAGNOSIS — M25559 Pain in unspecified hip: Secondary | ICD-10-CM | POA: Diagnosis not present

## 2019-12-13 DIAGNOSIS — I10 Essential (primary) hypertension: Secondary | ICD-10-CM | POA: Diagnosis not present

## 2019-12-13 DIAGNOSIS — I2510 Atherosclerotic heart disease of native coronary artery without angina pectoris: Secondary | ICD-10-CM | POA: Diagnosis not present

## 2019-12-13 DIAGNOSIS — E785 Hyperlipidemia, unspecified: Secondary | ICD-10-CM | POA: Diagnosis not present

## 2019-12-15 DIAGNOSIS — G4733 Obstructive sleep apnea (adult) (pediatric): Secondary | ICD-10-CM | POA: Diagnosis not present

## 2019-12-28 DIAGNOSIS — Z01818 Encounter for other preprocedural examination: Secondary | ICD-10-CM | POA: Diagnosis not present

## 2019-12-28 DIAGNOSIS — H2512 Age-related nuclear cataract, left eye: Secondary | ICD-10-CM | POA: Diagnosis not present

## 2019-12-28 DIAGNOSIS — H40033 Anatomical narrow angle, bilateral: Secondary | ICD-10-CM | POA: Diagnosis not present

## 2019-12-30 DIAGNOSIS — M25511 Pain in right shoulder: Secondary | ICD-10-CM | POA: Diagnosis not present

## 2019-12-30 DIAGNOSIS — M25559 Pain in unspecified hip: Secondary | ICD-10-CM | POA: Diagnosis not present

## 2019-12-30 DIAGNOSIS — M5417 Radiculopathy, lumbosacral region: Secondary | ICD-10-CM | POA: Diagnosis not present

## 2019-12-30 DIAGNOSIS — G894 Chronic pain syndrome: Secondary | ICD-10-CM | POA: Diagnosis not present

## 2020-01-11 DIAGNOSIS — J449 Chronic obstructive pulmonary disease, unspecified: Secondary | ICD-10-CM | POA: Diagnosis not present

## 2020-01-11 DIAGNOSIS — H9190 Unspecified hearing loss, unspecified ear: Secondary | ICD-10-CM | POA: Diagnosis not present

## 2020-01-11 DIAGNOSIS — I2510 Atherosclerotic heart disease of native coronary artery without angina pectoris: Secondary | ICD-10-CM | POA: Diagnosis not present

## 2020-01-11 DIAGNOSIS — K5909 Other constipation: Secondary | ICD-10-CM | POA: Diagnosis not present

## 2020-01-11 DIAGNOSIS — Z9682 Presence of neurostimulator: Secondary | ICD-10-CM | POA: Diagnosis not present

## 2020-01-11 DIAGNOSIS — E785 Hyperlipidemia, unspecified: Secondary | ICD-10-CM | POA: Diagnosis not present

## 2020-01-11 DIAGNOSIS — Z9981 Dependence on supplemental oxygen: Secondary | ICD-10-CM | POA: Diagnosis not present

## 2020-01-11 DIAGNOSIS — H259 Unspecified age-related cataract: Secondary | ICD-10-CM | POA: Diagnosis not present

## 2020-01-11 DIAGNOSIS — Z981 Arthrodesis status: Secondary | ICD-10-CM | POA: Diagnosis not present

## 2020-01-11 DIAGNOSIS — I10 Essential (primary) hypertension: Secondary | ICD-10-CM | POA: Diagnosis not present

## 2020-01-11 DIAGNOSIS — M1990 Unspecified osteoarthritis, unspecified site: Secondary | ICD-10-CM | POA: Diagnosis not present

## 2020-01-11 DIAGNOSIS — Z955 Presence of coronary angioplasty implant and graft: Secondary | ICD-10-CM | POA: Diagnosis not present

## 2020-01-11 DIAGNOSIS — H2512 Age-related nuclear cataract, left eye: Secondary | ICD-10-CM | POA: Diagnosis not present

## 2020-01-12 DIAGNOSIS — I2510 Atherosclerotic heart disease of native coronary artery without angina pectoris: Secondary | ICD-10-CM | POA: Diagnosis not present

## 2020-01-12 DIAGNOSIS — I10 Essential (primary) hypertension: Secondary | ICD-10-CM | POA: Diagnosis not present

## 2020-01-12 DIAGNOSIS — E785 Hyperlipidemia, unspecified: Secondary | ICD-10-CM | POA: Diagnosis not present

## 2020-01-13 DIAGNOSIS — H6123 Impacted cerumen, bilateral: Secondary | ICD-10-CM | POA: Diagnosis not present

## 2020-01-28 DIAGNOSIS — M25511 Pain in right shoulder: Secondary | ICD-10-CM | POA: Diagnosis not present

## 2020-01-28 DIAGNOSIS — G894 Chronic pain syndrome: Secondary | ICD-10-CM | POA: Diagnosis not present

## 2020-01-28 DIAGNOSIS — M5417 Radiculopathy, lumbosacral region: Secondary | ICD-10-CM | POA: Diagnosis not present

## 2020-01-28 DIAGNOSIS — M25559 Pain in unspecified hip: Secondary | ICD-10-CM | POA: Diagnosis not present

## 2020-01-28 DIAGNOSIS — Z79891 Long term (current) use of opiate analgesic: Secondary | ICD-10-CM | POA: Diagnosis not present

## 2020-01-28 DIAGNOSIS — Z79899 Other long term (current) drug therapy: Secondary | ICD-10-CM | POA: Diagnosis not present

## 2020-02-09 DIAGNOSIS — Z79899 Other long term (current) drug therapy: Secondary | ICD-10-CM | POA: Diagnosis not present

## 2020-02-09 DIAGNOSIS — G894 Chronic pain syndrome: Secondary | ICD-10-CM | POA: Diagnosis not present

## 2020-02-09 DIAGNOSIS — Z79891 Long term (current) use of opiate analgesic: Secondary | ICD-10-CM | POA: Diagnosis not present

## 2020-02-12 DIAGNOSIS — E785 Hyperlipidemia, unspecified: Secondary | ICD-10-CM | POA: Diagnosis not present

## 2020-02-12 DIAGNOSIS — I2510 Atherosclerotic heart disease of native coronary artery without angina pectoris: Secondary | ICD-10-CM | POA: Diagnosis not present

## 2020-02-12 DIAGNOSIS — I10 Essential (primary) hypertension: Secondary | ICD-10-CM | POA: Diagnosis not present

## 2020-03-02 DIAGNOSIS — M5417 Radiculopathy, lumbosacral region: Secondary | ICD-10-CM | POA: Diagnosis not present

## 2020-03-02 DIAGNOSIS — M25559 Pain in unspecified hip: Secondary | ICD-10-CM | POA: Diagnosis not present

## 2020-03-02 DIAGNOSIS — M25511 Pain in right shoulder: Secondary | ICD-10-CM | POA: Diagnosis not present

## 2020-03-02 DIAGNOSIS — G894 Chronic pain syndrome: Secondary | ICD-10-CM | POA: Diagnosis not present

## 2020-03-14 DIAGNOSIS — G4733 Obstructive sleep apnea (adult) (pediatric): Secondary | ICD-10-CM | POA: Diagnosis not present

## 2020-03-14 DIAGNOSIS — E785 Hyperlipidemia, unspecified: Secondary | ICD-10-CM | POA: Diagnosis not present

## 2020-03-14 DIAGNOSIS — I10 Essential (primary) hypertension: Secondary | ICD-10-CM | POA: Diagnosis not present

## 2020-03-14 DIAGNOSIS — I2510 Atherosclerotic heart disease of native coronary artery without angina pectoris: Secondary | ICD-10-CM | POA: Diagnosis not present

## 2020-04-27 DIAGNOSIS — M25559 Pain in unspecified hip: Secondary | ICD-10-CM | POA: Diagnosis not present

## 2020-04-27 DIAGNOSIS — G894 Chronic pain syndrome: Secondary | ICD-10-CM | POA: Diagnosis not present

## 2020-04-27 DIAGNOSIS — M25511 Pain in right shoulder: Secondary | ICD-10-CM | POA: Diagnosis not present

## 2020-04-27 DIAGNOSIS — M5417 Radiculopathy, lumbosacral region: Secondary | ICD-10-CM | POA: Diagnosis not present

## 2020-05-02 DIAGNOSIS — L853 Xerosis cutis: Secondary | ICD-10-CM | POA: Diagnosis not present

## 2020-05-13 DIAGNOSIS — E785 Hyperlipidemia, unspecified: Secondary | ICD-10-CM | POA: Diagnosis not present

## 2020-05-13 DIAGNOSIS — I2510 Atherosclerotic heart disease of native coronary artery without angina pectoris: Secondary | ICD-10-CM | POA: Diagnosis not present

## 2020-05-13 DIAGNOSIS — I10 Essential (primary) hypertension: Secondary | ICD-10-CM | POA: Diagnosis not present

## 2020-06-01 DIAGNOSIS — M5417 Radiculopathy, lumbosacral region: Secondary | ICD-10-CM | POA: Diagnosis not present

## 2020-06-01 DIAGNOSIS — G894 Chronic pain syndrome: Secondary | ICD-10-CM | POA: Diagnosis not present

## 2020-06-01 DIAGNOSIS — M25511 Pain in right shoulder: Secondary | ICD-10-CM | POA: Diagnosis not present

## 2020-06-01 DIAGNOSIS — M25559 Pain in unspecified hip: Secondary | ICD-10-CM | POA: Diagnosis not present

## 2020-06-12 DIAGNOSIS — G4733 Obstructive sleep apnea (adult) (pediatric): Secondary | ICD-10-CM | POA: Diagnosis not present

## 2020-06-13 DIAGNOSIS — I2510 Atherosclerotic heart disease of native coronary artery without angina pectoris: Secondary | ICD-10-CM | POA: Diagnosis not present

## 2020-06-13 DIAGNOSIS — I10 Essential (primary) hypertension: Secondary | ICD-10-CM | POA: Diagnosis not present

## 2020-06-13 DIAGNOSIS — E785 Hyperlipidemia, unspecified: Secondary | ICD-10-CM | POA: Diagnosis not present

## 2020-07-05 DIAGNOSIS — F17210 Nicotine dependence, cigarettes, uncomplicated: Secondary | ICD-10-CM | POA: Diagnosis not present

## 2020-07-05 DIAGNOSIS — H938X2 Other specified disorders of left ear: Secondary | ICD-10-CM | POA: Diagnosis not present

## 2020-07-05 DIAGNOSIS — H6121 Impacted cerumen, right ear: Secondary | ICD-10-CM | POA: Diagnosis not present

## 2020-07-13 DIAGNOSIS — D519 Vitamin B12 deficiency anemia, unspecified: Secondary | ICD-10-CM | POA: Diagnosis not present

## 2020-07-13 DIAGNOSIS — J449 Chronic obstructive pulmonary disease, unspecified: Secondary | ICD-10-CM | POA: Diagnosis not present

## 2020-07-13 DIAGNOSIS — E782 Mixed hyperlipidemia: Secondary | ICD-10-CM | POA: Diagnosis not present

## 2020-07-13 DIAGNOSIS — I10 Essential (primary) hypertension: Secondary | ICD-10-CM | POA: Diagnosis not present

## 2020-07-18 DIAGNOSIS — M79606 Pain in leg, unspecified: Secondary | ICD-10-CM | POA: Diagnosis not present

## 2020-07-18 DIAGNOSIS — Z79899 Other long term (current) drug therapy: Secondary | ICD-10-CM | POA: Diagnosis not present

## 2020-07-18 DIAGNOSIS — G894 Chronic pain syndrome: Secondary | ICD-10-CM | POA: Diagnosis not present

## 2020-07-18 DIAGNOSIS — M5417 Radiculopathy, lumbosacral region: Secondary | ICD-10-CM | POA: Diagnosis not present

## 2020-07-18 DIAGNOSIS — M25559 Pain in unspecified hip: Secondary | ICD-10-CM | POA: Diagnosis not present

## 2020-07-18 DIAGNOSIS — Z79891 Long term (current) use of opiate analgesic: Secondary | ICD-10-CM | POA: Diagnosis not present

## 2020-07-20 DIAGNOSIS — T148XXA Other injury of unspecified body region, initial encounter: Secondary | ICD-10-CM | POA: Diagnosis not present

## 2020-07-20 DIAGNOSIS — E782 Mixed hyperlipidemia: Secondary | ICD-10-CM | POA: Diagnosis not present

## 2020-07-20 DIAGNOSIS — Z0000 Encounter for general adult medical examination without abnormal findings: Secondary | ICD-10-CM | POA: Diagnosis not present

## 2020-07-20 DIAGNOSIS — Z23 Encounter for immunization: Secondary | ICD-10-CM | POA: Diagnosis not present

## 2020-07-25 DIAGNOSIS — Z720 Tobacco use: Secondary | ICD-10-CM | POA: Diagnosis not present

## 2020-07-25 DIAGNOSIS — I712 Thoracic aortic aneurysm, without rupture: Secondary | ICD-10-CM | POA: Diagnosis not present

## 2020-07-25 DIAGNOSIS — J449 Chronic obstructive pulmonary disease, unspecified: Secondary | ICD-10-CM | POA: Diagnosis not present

## 2020-08-08 DIAGNOSIS — Z981 Arthrodesis status: Secondary | ICD-10-CM | POA: Diagnosis not present

## 2020-08-08 DIAGNOSIS — M5459 Other low back pain: Secondary | ICD-10-CM | POA: Diagnosis not present

## 2020-08-11 DIAGNOSIS — L84 Corns and callosities: Secondary | ICD-10-CM | POA: Diagnosis not present

## 2020-08-11 DIAGNOSIS — C44622 Squamous cell carcinoma of skin of right upper limb, including shoulder: Secondary | ICD-10-CM | POA: Diagnosis not present

## 2020-08-11 DIAGNOSIS — D485 Neoplasm of uncertain behavior of skin: Secondary | ICD-10-CM | POA: Diagnosis not present

## 2020-08-13 DIAGNOSIS — E785 Hyperlipidemia, unspecified: Secondary | ICD-10-CM | POA: Diagnosis not present

## 2020-08-13 DIAGNOSIS — I10 Essential (primary) hypertension: Secondary | ICD-10-CM | POA: Diagnosis not present

## 2020-08-13 DIAGNOSIS — I2510 Atherosclerotic heart disease of native coronary artery without angina pectoris: Secondary | ICD-10-CM | POA: Diagnosis not present

## 2020-08-18 DIAGNOSIS — M4727 Other spondylosis with radiculopathy, lumbosacral region: Secondary | ICD-10-CM | POA: Diagnosis not present

## 2020-08-18 DIAGNOSIS — M961 Postlaminectomy syndrome, not elsewhere classified: Secondary | ICD-10-CM | POA: Diagnosis not present

## 2020-08-23 DIAGNOSIS — C44622 Squamous cell carcinoma of skin of right upper limb, including shoulder: Secondary | ICD-10-CM | POA: Diagnosis not present

## 2020-08-28 DIAGNOSIS — Z01818 Encounter for other preprocedural examination: Secondary | ICD-10-CM | POA: Diagnosis not present

## 2020-09-11 DIAGNOSIS — E785 Hyperlipidemia, unspecified: Secondary | ICD-10-CM | POA: Diagnosis not present

## 2020-09-11 DIAGNOSIS — I2510 Atherosclerotic heart disease of native coronary artery without angina pectoris: Secondary | ICD-10-CM | POA: Diagnosis not present

## 2020-09-11 DIAGNOSIS — I10 Essential (primary) hypertension: Secondary | ICD-10-CM | POA: Diagnosis not present

## 2020-09-11 DIAGNOSIS — G4733 Obstructive sleep apnea (adult) (pediatric): Secondary | ICD-10-CM | POA: Diagnosis not present

## 2020-09-13 DIAGNOSIS — Z1159 Encounter for screening for other viral diseases: Secondary | ICD-10-CM | POA: Diagnosis not present

## 2020-09-14 DIAGNOSIS — M79605 Pain in left leg: Secondary | ICD-10-CM | POA: Diagnosis not present

## 2020-09-14 DIAGNOSIS — M961 Postlaminectomy syndrome, not elsewhere classified: Secondary | ICD-10-CM | POA: Diagnosis not present

## 2020-09-14 DIAGNOSIS — G894 Chronic pain syndrome: Secondary | ICD-10-CM | POA: Diagnosis not present

## 2020-09-14 DIAGNOSIS — M4727 Other spondylosis with radiculopathy, lumbosacral region: Secondary | ICD-10-CM | POA: Diagnosis not present

## 2020-09-15 ENCOUNTER — Other Ambulatory Visit: Payer: Self-pay | Admitting: Orthopedic Surgery

## 2020-09-15 DIAGNOSIS — M545 Low back pain, unspecified: Secondary | ICD-10-CM

## 2020-09-15 DIAGNOSIS — M5459 Other low back pain: Secondary | ICD-10-CM | POA: Diagnosis not present

## 2020-09-19 DIAGNOSIS — K635 Polyp of colon: Secondary | ICD-10-CM | POA: Diagnosis not present

## 2020-09-19 DIAGNOSIS — K6389 Other specified diseases of intestine: Secondary | ICD-10-CM | POA: Diagnosis not present

## 2020-09-19 DIAGNOSIS — Z1211 Encounter for screening for malignant neoplasm of colon: Secondary | ICD-10-CM | POA: Diagnosis not present

## 2020-09-19 DIAGNOSIS — D122 Benign neoplasm of ascending colon: Secondary | ICD-10-CM | POA: Diagnosis not present

## 2020-09-21 ENCOUNTER — Telehealth: Payer: Self-pay

## 2020-09-21 ENCOUNTER — Ambulatory Visit
Admission: RE | Admit: 2020-09-21 | Discharge: 2020-09-21 | Disposition: A | Discharge status: Home / Self Care | Payer: Medicare Other | Source: Ambulatory Visit | Attending: Orthopedic Surgery | Admitting: Orthopedic Surgery

## 2020-09-21 ENCOUNTER — Other Ambulatory Visit: Payer: Self-pay

## 2020-09-21 DIAGNOSIS — M545 Low back pain, unspecified: Secondary | ICD-10-CM

## 2020-09-21 DIAGNOSIS — M4326 Fusion of spine, lumbar region: Secondary | ICD-10-CM | POA: Diagnosis not present

## 2020-09-21 DIAGNOSIS — M48061 Spinal stenosis, lumbar region without neurogenic claudication: Secondary | ICD-10-CM | POA: Diagnosis not present

## 2020-09-21 MED ORDER — DIAZEPAM 5 MG PO TABS
5.0000 mg | ORAL_TABLET | Freq: Once | ORAL | Status: AC
  Administered 2020-09-21: 5 mg via ORAL

## 2020-09-21 MED ORDER — IOPAMIDOL (ISOVUE-M 200) INJECTION 41%
20.0000 mL | Freq: Once | INTRAMUSCULAR | Status: AC
  Administered 2020-09-21: 20 mL via INTRATHECAL

## 2020-09-21 NOTE — Discharge Instructions (Signed)

## 2020-09-21 NOTE — Telephone Encounter (Signed)
Pt screened on 09/15/20. Phone call to patient to verify medication list and allergies for myelogram procedure. All medications pt is currently taking is safe to continue to take for myelogram procedure. Pt verbalized understanding. Pt also advised he would be with [place] around 2 hours, to have a driver the day of the procedure, and he would need to lay flat for 24 hours post procedure. Pt verbalized understanding. Pt has SCS.  Pt to bring remote.

## 2020-09-25 DIAGNOSIS — L570 Actinic keratosis: Secondary | ICD-10-CM | POA: Diagnosis not present

## 2020-10-06 DIAGNOSIS — M5459 Other low back pain: Secondary | ICD-10-CM | POA: Diagnosis not present

## 2020-10-11 ENCOUNTER — Ambulatory Visit: Payer: Self-pay | Admitting: Orthopedic Surgery

## 2020-10-11 DIAGNOSIS — I2510 Atherosclerotic heart disease of native coronary artery without angina pectoris: Secondary | ICD-10-CM | POA: Diagnosis not present

## 2020-10-11 DIAGNOSIS — E785 Hyperlipidemia, unspecified: Secondary | ICD-10-CM | POA: Diagnosis not present

## 2020-10-11 DIAGNOSIS — I10 Essential (primary) hypertension: Secondary | ICD-10-CM | POA: Diagnosis not present

## 2020-10-12 DIAGNOSIS — I259 Chronic ischemic heart disease, unspecified: Secondary | ICD-10-CM | POA: Diagnosis not present

## 2020-10-12 DIAGNOSIS — Z720 Tobacco use: Secondary | ICD-10-CM | POA: Diagnosis not present

## 2020-10-12 DIAGNOSIS — G894 Chronic pain syndrome: Secondary | ICD-10-CM | POA: Diagnosis not present

## 2020-10-12 DIAGNOSIS — M4727 Other spondylosis with radiculopathy, lumbosacral region: Secondary | ICD-10-CM | POA: Diagnosis not present

## 2020-10-12 DIAGNOSIS — Z79891 Long term (current) use of opiate analgesic: Secondary | ICD-10-CM | POA: Diagnosis not present

## 2020-10-12 DIAGNOSIS — M79605 Pain in left leg: Secondary | ICD-10-CM | POA: Diagnosis not present

## 2020-10-12 DIAGNOSIS — J449 Chronic obstructive pulmonary disease, unspecified: Secondary | ICD-10-CM | POA: Diagnosis not present

## 2020-10-12 DIAGNOSIS — M1990 Unspecified osteoarthritis, unspecified site: Secondary | ICD-10-CM | POA: Diagnosis not present

## 2020-10-12 DIAGNOSIS — M961 Postlaminectomy syndrome, not elsewhere classified: Secondary | ICD-10-CM | POA: Diagnosis not present

## 2020-10-12 DIAGNOSIS — Z79899 Other long term (current) drug therapy: Secondary | ICD-10-CM | POA: Diagnosis not present

## 2020-10-20 DIAGNOSIS — I2510 Atherosclerotic heart disease of native coronary artery without angina pectoris: Secondary | ICD-10-CM | POA: Diagnosis not present

## 2020-10-20 DIAGNOSIS — I452 Bifascicular block: Secondary | ICD-10-CM | POA: Diagnosis not present

## 2020-10-20 DIAGNOSIS — I714 Abdominal aortic aneurysm, without rupture: Secondary | ICD-10-CM | POA: Diagnosis not present

## 2020-10-20 DIAGNOSIS — E785 Hyperlipidemia, unspecified: Secondary | ICD-10-CM | POA: Diagnosis not present

## 2020-10-20 DIAGNOSIS — I6529 Occlusion and stenosis of unspecified carotid artery: Secondary | ICD-10-CM | POA: Diagnosis not present

## 2020-10-20 DIAGNOSIS — Z01818 Encounter for other preprocedural examination: Secondary | ICD-10-CM | POA: Diagnosis not present

## 2020-10-20 DIAGNOSIS — I10 Essential (primary) hypertension: Secondary | ICD-10-CM | POA: Diagnosis not present

## 2020-10-20 DIAGNOSIS — Z720 Tobacco use: Secondary | ICD-10-CM | POA: Diagnosis not present

## 2020-10-20 DIAGNOSIS — I712 Thoracic aortic aneurysm, without rupture: Secondary | ICD-10-CM | POA: Diagnosis not present

## 2020-10-20 DIAGNOSIS — R0789 Other chest pain: Secondary | ICD-10-CM | POA: Diagnosis not present

## 2020-10-25 DIAGNOSIS — I6523 Occlusion and stenosis of bilateral carotid arteries: Secondary | ICD-10-CM | POA: Diagnosis not present

## 2020-11-03 ENCOUNTER — Ambulatory Visit: Payer: Self-pay | Admitting: Orthopedic Surgery

## 2020-11-03 DIAGNOSIS — M545 Low back pain, unspecified: Secondary | ICD-10-CM | POA: Diagnosis not present

## 2020-11-03 NOTE — H&P (Deleted)
  The note originally documented on this encounter has been moved the the encounter in which it belongs.  

## 2020-11-03 NOTE — H&P (Signed)
Subjective:   Nathan Pham is a plesant 70 yo male with hear issues (on ASA, and plavix) amd Previouos L4-S1 fusion, SCS. Initially presented on 08/08/2020 for increasng LBP and bilateral radicular thigh pain. Despite appropriate conservative care including an injection and chronic opioid use the patient continues to have severe debilitating pain and he has elected to move forward with surgical intervention. He is scheduled for XLIF L3-4 & lat plate [facility] [id number] with Dr. Rolena Infante   Patient Active Problem List   Diagnosis Date Noted  . Hyperlipemia, mixed   . Coronary artery disease involving native coronary artery of native heart with unstable angina pectoris ([place])   . Chest pain 12/24/2016  . Back pain 10/21/2013  . COPD GOLD III 09/21/2013  . HBP (high blood pressure) 09/21/2013  . Smoker 09/21/2013   Past Medical History:  Diagnosis Date  . Arthritis    "just about qwhere" (12/24/2016)  . CAD (coronary artery disease), native coronary artery    PCI--> RCA '09  . Chronic lower back pain   . COPD (chronic obstructive pulmonary disease) ([place])   . Hyperlipidemia   . Hypertension   . OSA on CPAP   . Silent myocardial infarction [facility]) 2006   "found it when they were working me up for a back OR"    Past Surgical History:  Procedure Laterality Date  . ANTERIOR CERVICAL DECOMP/[place]CECTOMY [place]US[place]  [place]  . ANTERIOR LAT LUMBAR FUSION N/A 10/21/2013   Procedure: ANTERIOR LATERAL LUMBAR FUSION 1 LEVEL(XLIF) L4-5;  Surgeon: Melina Schools, MD;  Location: [place];  Service: Orthopedics;  Laterality: N/A;  . ANTERIOR LUMBAR FUSION  2010  . BACK SURGERY    . CARDIAC CATHETERIZATION  2006   "[place]"  . CERVICAL DISC SURG[facility]  [place] [facility]   [place]"  . COLONOSCOPY  2016  . CORONARY ANGIOPLASTY WITH STENT PLACEMENT  2009   [facility]  . CORONARY BALLOON ANGIOPLASTY N/A 12/25/2016   Procedure: Coronary Balloon Angioplasty;  Surgeon: Sherren Mocha, MD;  Location: [place] [facility];  Service: Cardiovascular;  Laterality: N/A;  . CORONARY STENT INTERVENTION N/A 12/25/2016   Procedure: Coronary Stent Intervention;  Surgeon: Sherren Mocha, MD;  Location: [place] [facility];  Service: Cardiovascular;  Laterality: N/A;  . INCISION AND DRAINAGE POSTERIOR LUMBAR SPINE  2010   "[facility]  . INGUINAL HERNIA REPAIR Bilateral ~ 2016  . LAPAROSCOPIC CHOLECYSTECTOMY    . LEFT HEART CATH AND CORONARY ANGIOGRAPHY N/A 12/25/2016   Procedure: Left Heart Cath and Coronary Angiography;  Surgeon: Sherren Mocha, MD;  Location: [place] [facility];  Service: Cardiovascular;  Laterality: N/A;  . LUMBAR [place] SURGERY  2006   "[facility]"  . NASAL SEPTUM SURGERY  2000s?  Marland Kitchen NASAL SINUS SURGERY  1990s?  Marland Kitchen POSTERIOR LUMBAR FUSION  2010; ~ 2012   "2 months after anterior fusion; another fusion"  . SPINAL CORD STIMULATOR IMPLANT  2012; 04/10/2016  . SPINAL CORD STIMULATOR REMOVAL  04/10/2016   "removed 1st stimulator"  . TONSILLECTOMY      Current Outpatient Medications  Medication Sig Dispense Refill Last Dose  . albuterol (PROVENTIL HFA;VENTOLIN HFA) 108 (90 Base) MCG/ACT inhaler Inhale 2 puffs into the lungs every 6 (six) hours as needed for wheezing or shortness of breath.     Marland Kitchen amLODipine (NORVASC) 5 MG tablet Take 1 tablet (5 mg total) by mouth daily. 30 tablet 2   . aspirin EC 81 MG EC  tablet Take 1 tablet (81 mg total) by mouth daily.     . baclofen (LIORESAL) 10 MG tablet Take 10 mg by mouth 2 (two) times daily. (Patient not taking: Reported on 09/15/2020)     . Cholecalciferol 25 MCG (1000 UT) tablet Take by mouth.     . clopidogrel (PLAVIX) 75 MG tablet Take 1 tablet by mouth daily.     Marland Kitchen docusate sodium (COLACE) 50 MG capsule Take 100 mg by mouth daily.      . hydrochlorothiazide (HYDRODIURIL) 25 MG tablet Take 25 mg by mouth daily.     . hydrochlorothiazide (HYDRODIURIL) 25 MG tablet Take by mouth.     Marland Kitchen lisinopril (PRINIVIL,ZESTRIL) 40 MG tablet Take 40 mg by mouth  daily.     . Melatonin 10 MG TABS Take by mouth.     . metoprolol succinate (TOPROL-XL) 50 MG 24 hr tablet Take 50 mg by mouth daily. Take with or immediately following a meal.     . Multiple Vitamin (MULTIVITAMIN) capsule Take 1 capsule by mouth daily.     . naloxone (NARCAN) nasal spray 4 mg/0.1 mL INSTILL 1 SPRAY AS NEEDED     . nitroGLYCERIN (NITROSTAT) 0.4 MG SL tablet Place 0.4 mg under the tongue every 5 (five) minutes as needed for chest pain.     . Oxycodone HCl 10 MG TABS Take by mouth.     . oxyCODONE-acetaminophen (PERCOCET) 10-325 MG tablet Take by mouth.     . ranolazine (RANEXA) 500 MG 12 hr tablet Take by mouth.     . rosuvastatin (CRESTOR) 20 MG tablet TAKE ONE TABLET BY MOUTH EVERYDAY AT BEDTIME     . senna (SENOKOT) 8.6 MG TABS tablet Take 1 tablet by mouth daily.     Marland Kitchen umeclidinium-vilanterol (ANORO ELLIPTA) 62.5-25 MCG/INH AEPB Inhale 1 puff into the lungs daily.      No current facility-administered medications for this visit.   No Known Allergies  Social History   Tobacco Use  . Smoking status: Current Every Day Smoker    Packs/day: 0.12    Years: 57.00    Pack years: 6.84    Types: Cigarettes, Cigars  . Smokeless tobacco: Never Used  . Tobacco comment: "started smoking at age 81; smoked 2-3 ppd til age 26"  Substance Use Topics  . Alcohol use: Yes    Comment: 12/24/2016 "I'll have a beer q 2-6 weeks"    Family History  Problem Relation Age of Onset  . Heart disease Mother     Review of Systems Pertinent items are noted in HPI.  Objective:   Vitals: Ht: 5 ft 10 in 11/03/2020 10:54 am BP: 100/70 11/03/2020 11:00 am Pulse: 90 bpm 11/03/2020 10:58 am O2Sat: 88% 11/03/2020 10:58 am  Clinical exam: Nathan Pham is a pleasant individual, who appears younger than their stated age. He is alert and orientated 3. No shortness of breath, chest pain.  Heart: RRR, no rubs, murmers, or gallops  Lungs: CTAB  Abdomen is soft and non-tender, negative loss of bowel  and bladder control, no rebound tenderness. BSx4  Negative: skin lesions abrasions contusions  Peripheral pulses: 2+ dorsalis pedis/posterior tibialis pulses bilaterally. Compartment soft and nontender. Gait pattern: Slightly altered gait pattern due to chronic low back pain with radiation into the left lower extremity. Assistive devices: Cane  Neuro: 5/5 motor strength in lower extremity bilaterally. Positive numbness and dysesthesias bilaterally in the lower extremity left side worse than the right. Negative Babinski test, negative straight leg  raise test. Symmetrical 1+ deep tendon reflexes at the knee, absent at the Achilles  Musculoskeletal: Significant back pain with palpation and range of motion. Pain radiates into the paraspinal region. Pain is worse with lateral bending and flexion.  Lumbar x-rays (AP/lateral/spot lateral) were reviewed: L3-4 asymmetrical disc collapse with significant left-sided facet arthrosis producing lateral recess and foraminal stenosis. No hardware complications with the lateral interbody fusion device at L3-4 an anterior lumbar body fusion device at L5-S1. Pedicle screw construct a satisfactory. Patient has a solid L4 S1 fusion.  CT myelogram of the lumbar spine: completed on 09/21/20 was reviewed with the patient. It was completed at [facility]; I have independently reviewed the images as well as the radiology report. Solid L4 S1 fusion. No evidence of screw loosening or migration of the implants. Spinal cord and foramen are patent with no significant stenosis. Progressive degenerative disease at the L3-4 level. With asymmetrical collapse left side worse than the right. Mild spinal stenosis as well as progression in the left lateral and foraminal stenosis when compared to prior CT scan in 2018. There is also a 3.2 cm infrarenal abdominal aortic aneurysm which is also mildly increased from 2018.  Patient reports significant improvement for 3 days in his back  pain after the injection.  Assessment:   Alroy is a plesant 69 yo male with hear issues (on ASA, and plavix) amd Previouos L4-S1 fusion, SCS. Initially presented on 08/08/2020 for increasng LBP and bilateral radicular thigh pain. Despite appropriate conservative care including an injection and chronic opioid use the patient continues to have severe debilitating pain and he has elected to move forward with surgical intervention. At this point time if he were to move forward with surgery then we would need to address the adjacent segment disease with a lateral interbody fusion at G2-6 with application of a lateral plate. The lateral plate would remove the need for supplemental posterior fixation and thereby decrease the overall surgical time and complexity. I have gone over the surgical procedure in great detail with him including the risks, benefits, and alternatives.  Plan:   Extreme lateral interbody fusion with lateral plate of R4-8  OLIF/XLIF risks, benefits of surgery were reviewed with the patient. These include: infection, bleeding, death, stroke, paralysis, ongoing or worse pain, need for additional surgery, injury to the lumbar plexus resulting in hip flexor weakness and difficulty walking without assistive devices. Adjacent segment degenerative disease, need for additional surgery including fusing other levels, leak of spinal fluid, Nonunion, hardware failure, breakage, or mal-position. Deep venous thrombosis (DVT) requiring additional treatment such as filter, and/or medications. Injury to abdominal contents, loss in bowel and bladder control.  We received preoperative medical clearance from the patient's primary care provider and cardiologist. He will hold his Plavix and aspirin 7 days prior to surgery and we will plan to restart it 24-48 hours after surgery. He is also on a multivitamin which she will hold 7 days prior to surgery.  We have also discussed the post-operative recovery period  to include: bathing/showering restrictions, wound healing, activity (and driving) restrictions, medications/pain mangement. Patient is in pain management on chronic opioids I have discusssed with him that he should plan on getting his postoperative pain meds through his pain management provider. He expressed understanding of this.  We have also discussed post-operative redflags to include: signs and symptoms of postoperative infection, DVT/PE.  Discharge instructions were reviewed with the patient today. He was given a paper copy of these  Patient has been  fitted for LSO brace.  All questions were addressed  Follow-up: 2 weeks postop

## 2020-11-06 ENCOUNTER — Other Ambulatory Visit: Payer: Self-pay | Admitting: Orthopedic Surgery

## 2020-11-10 NOTE — Progress Notes (Incomplete)
Patient denies shortness of breath, fever, cough or chest pain.  PCP - Dr Christa See Cardiologist - Talbert Cage, NP Pain Mgmt - Vanice Sarah, NP  Chest x-ray - 11/13/20 EKG - 10/20/20 [facility] Stress Test - 2013 ECHO - *** Cardiac Cath - 12/25/16  Sleep Study -  Yes CPAP - ***  Aspirin and Plavix  Instructions: Follow your surgeon's instructions on when to stop aspirin and plavix prior to surgery,  If no instructions were given by your surgeon then you will need to call the office for those instructions.  Anesthesia review: Yes  STOP now taking any Aspirin (unless otherwise instructed by your surgeon), Aleve, Naproxen, Ibuprofen, Motrin, Advil, Goody's, BC's, all herbal medications, fish oil, and all vitamins.   Coronavirus Screening Covid test at PAT appt. 11/13/20 Do you have any of the following symptoms:  Cough {yes/no:20286:::1} Fever (>100.80F)  {yes/no:20286:::1} Runny nose {yes/no:20286:::1} Sore throat {yes/no:20286:::1} Difficulty breathing/shortness of breath  {yes/no:20286:::1}  Have you traveled in the last 14 days and where? {yes/no:20286:::1}  Patient verbalized understanding of instructions that were given to them at the PAT appointment.

## 2020-11-10 NOTE — Progress Notes (Signed)
Surgical Instructions    Your procedure is scheduled on Wednesday, May 4th, 2022  Report to [facility] Main Entrance "A" at 11:20 A.M., then check in with the Admitting office.  Call this number if you have problems the morning of surgery:  [phone]   If you have any questions prior to your surgery date call [phone]: Open Monday-Friday 8am-4pm    Remember:  Do not eat and drink after midnight the night before your surgery   Take these medicines the morning of surgery with A SIP OF WATER   metoprolol succinate (TOPROL-XL)  Oxycodone HCl ranolazine (RANEXA) umeclidinium-vilanterol (ANORO ELLIPTA) - please, bring the inhaler with you naloxone [facility])  If needed  albuterol (PROVENTIL HFA;VENTOLIN HFA) - please, bring the inhaler with you nitroGLYCERIN (NITROSTAT) - please, let the nurse know if you use it  Follow your surgeon's instructions on when to stop Aspirin and Plavix. If no instructions were given by your surgeon then you will need to call the office to get those instructions.    As of today, STOP taking Aleve, Naproxen, Ibuprofen, Motrin, Advil, Goody's, BC's, all herbal medications, fish oil, and all vitamins.                     Do not wear jewelry            Do not wear lotions, powders, colognes, or deodorant.            Men may shave face and neck.            Do not bring valuables to the hospital.            [facility] is not responsible for any belongings or valuables.  Do NOT Smoke (Tobacco/Vaping) or drink Alcohol 24 hours prior to your procedure If you use a CPAP at night, you may bring all equipment for your overnight stay.   Contacts, glasses, dentures or bridgework may not be worn into surgery, please bring cases for these belongings   For patients admitted to the hospital, discharge time will be determined by your treatment team.   Patients discharged the day of surgery will not be allowed to drive home, and someone needs to stay with them for  24 hours.    Special instructions:   - Preparing For Surgery  Before surgery, you can play an important role. Because skin is not sterile, your skin needs to be as free of germs as possible. You can reduce the number of germs on your skin by washing with CHG (chlorahexidine gluconate) Soap before surgery.  CHG is an antiseptic cleaner which kills germs and bonds with the skin to continue killing germs even after washing.    Oral Hygiene is also important to reduce your risk of infection.  Remember - BRUSH YOUR TEETH THE MORNING OF SURGERY WITH YOUR REGULAR TOOTHPASTE  Please do not use if you have an allergy to CHG or antibacterial soaps. If your skin becomes reddened/irritated stop using the CHG.  Do not shave (including legs and underarms) for at least 48 hours prior to first CHG shower. It is OK to shave your face.  Please follow these instructions carefully.   1. Shower the NIGHT BEFORE SURGERY and the MORNING OF SURGERY  2. If you chose to wash your hair, wash your hair first as usual with your normal shampoo.  3. After you shampoo, rinse your hair and body thoroughly to remove the shampoo.  4. Wash Face  and genitals (private parts) with your normal soap.   5.  Shower the NIGHT BEFORE SURGERY and the MORNING OF SURGERY with CHG Soap.   6. Use CHG Soap as you would any other liquid soap. You can apply CHG directly to the skin and wash gently with a scrungie or a clean washcloth.   7. Apply the CHG Soap to your body ONLY FROM THE NECK DOWN.  Do not use on open wounds or open sores. Avoid contact with your eyes, ears, mouth and genitals (private parts). Wash Face and genitals (private parts)  with your normal soap.   8. Wash thoroughly, paying special attention to the area where your surgery will be performed.  9. Thoroughly rinse your body with warm water from the neck down.  10. DO NOT shower/wash with your normal soap after using and rinsing off the CHG  Soap.  11. Pat yourself dry with a CLEAN TOWEL.  12. Wear CLEAN PAJAMAS to bed the night before surgery  13. Place CLEAN SHEETS on your bed the night before your surgery  14. DO NOT SLEEP WITH PETS.   Day of Surgery: Take a shower with CHG soap. Wear Clean/Comfortable clothing the morning of surgery Do not apply any deodorants/lotions.   Remember to brush your teeth WITH YOUR REGULAR TOOTHPASTE.   Please read over the following fact sheets that you were given.

## 2020-11-13 ENCOUNTER — Encounter (HOSPITAL_COMMUNITY): Payer: Self-pay

## 2020-11-13 ENCOUNTER — Encounter (HOSPITAL_COMMUNITY)
Admission: RE | Admit: 2020-11-13 | Discharge: 2020-11-13 | Disposition: A | Discharge status: Home / Self Care | Payer: Medicare Other | Source: Ambulatory Visit | Attending: Orthopedic Surgery | Admitting: Orthopedic Surgery

## 2020-11-13 ENCOUNTER — Ambulatory Visit (HOSPITAL_COMMUNITY)
Admission: RE | Admit: 2020-11-13 | Discharge: 2020-11-13 | Disposition: A | Discharge status: Home / Self Care | Payer: Medicare Other | Source: Ambulatory Visit | Attending: Orthopedic Surgery | Admitting: Orthopedic Surgery

## 2020-11-13 ENCOUNTER — Other Ambulatory Visit: Payer: Self-pay

## 2020-11-13 DIAGNOSIS — I2510 Atherosclerotic heart disease of native coronary artery without angina pectoris: Secondary | ICD-10-CM | POA: Diagnosis not present

## 2020-11-13 DIAGNOSIS — I10 Essential (primary) hypertension: Secondary | ICD-10-CM | POA: Diagnosis not present

## 2020-11-13 DIAGNOSIS — Z01811 Encounter for preprocedural respiratory examination: Secondary | ICD-10-CM

## 2020-11-13 DIAGNOSIS — E782 Mixed hyperlipidemia: Secondary | ICD-10-CM | POA: Diagnosis not present

## 2020-11-13 DIAGNOSIS — I252 Old myocardial infarction: Secondary | ICD-10-CM | POA: Diagnosis not present

## 2020-11-13 DIAGNOSIS — G8918 Other acute postprocedural pain: Secondary | ICD-10-CM | POA: Diagnosis not present

## 2020-11-13 DIAGNOSIS — M5116 Intervertebral disc disorders with radiculopathy, lumbar region: Secondary | ICD-10-CM | POA: Diagnosis not present

## 2020-11-13 DIAGNOSIS — M48061 Spinal stenosis, lumbar region without neurogenic claudication: Secondary | ICD-10-CM | POA: Diagnosis not present

## 2020-11-13 DIAGNOSIS — G894 Chronic pain syndrome: Secondary | ICD-10-CM | POA: Diagnosis not present

## 2020-11-13 DIAGNOSIS — J449 Chronic obstructive pulmonary disease, unspecified: Secondary | ICD-10-CM | POA: Insufficient documentation

## 2020-11-13 DIAGNOSIS — Z7951 Long term (current) use of inhaled steroids: Secondary | ICD-10-CM | POA: Diagnosis not present

## 2020-11-13 DIAGNOSIS — G8929 Other chronic pain: Secondary | ICD-10-CM | POA: Diagnosis not present

## 2020-11-13 DIAGNOSIS — M4326 Fusion of spine, lumbar region: Secondary | ICD-10-CM | POA: Diagnosis not present

## 2020-11-13 DIAGNOSIS — Z01818 Encounter for other preprocedural examination: Secondary | ICD-10-CM | POA: Insufficient documentation

## 2020-11-13 DIAGNOSIS — Z7902 Long term (current) use of antithrombotics/antiplatelets: Secondary | ICD-10-CM | POA: Diagnosis not present

## 2020-11-13 DIAGNOSIS — F17210 Nicotine dependence, cigarettes, uncomplicated: Secondary | ICD-10-CM | POA: Diagnosis not present

## 2020-11-13 DIAGNOSIS — Z8249 Family history of ischemic heart disease and other diseases of the circulatory system: Secondary | ICD-10-CM | POA: Diagnosis not present

## 2020-11-13 DIAGNOSIS — Z7982 Long term (current) use of aspirin: Secondary | ICD-10-CM | POA: Diagnosis not present

## 2020-11-13 DIAGNOSIS — M4727 Other spondylosis with radiculopathy, lumbosacral region: Secondary | ICD-10-CM | POA: Diagnosis not present

## 2020-11-13 DIAGNOSIS — Z9682 Presence of neurostimulator: Secondary | ICD-10-CM | POA: Diagnosis not present

## 2020-11-13 DIAGNOSIS — Z955 Presence of coronary angioplasty implant and graft: Secondary | ICD-10-CM | POA: Diagnosis not present

## 2020-11-13 DIAGNOSIS — Z981 Arthrodesis status: Secondary | ICD-10-CM | POA: Diagnosis not present

## 2020-11-13 DIAGNOSIS — Z20822 Contact with and (suspected) exposure to covid-19: Secondary | ICD-10-CM | POA: Insufficient documentation

## 2020-11-13 DIAGNOSIS — H9190 Unspecified hearing loss, unspecified ear: Secondary | ICD-10-CM | POA: Diagnosis not present

## 2020-11-13 DIAGNOSIS — M961 Postlaminectomy syndrome, not elsewhere classified: Secondary | ICD-10-CM | POA: Diagnosis not present

## 2020-11-13 DIAGNOSIS — M79605 Pain in left leg: Secondary | ICD-10-CM | POA: Diagnosis not present

## 2020-11-13 DIAGNOSIS — E785 Hyperlipidemia, unspecified: Secondary | ICD-10-CM | POA: Diagnosis not present

## 2020-11-13 DIAGNOSIS — M2578 Osteophyte, vertebrae: Secondary | ICD-10-CM | POA: Diagnosis not present

## 2020-11-13 DIAGNOSIS — I712 Thoracic aortic aneurysm, without rupture: Secondary | ICD-10-CM | POA: Diagnosis not present

## 2020-11-13 DIAGNOSIS — M545 Low back pain, unspecified: Secondary | ICD-10-CM | POA: Diagnosis not present

## 2020-11-13 DIAGNOSIS — M1990 Unspecified osteoarthritis, unspecified site: Secondary | ICD-10-CM | POA: Diagnosis not present

## 2020-11-13 DIAGNOSIS — Z79899 Other long term (current) drug therapy: Secondary | ICD-10-CM | POA: Diagnosis not present

## 2020-11-13 DIAGNOSIS — G4733 Obstructive sleep apnea (adult) (pediatric): Secondary | ICD-10-CM | POA: Diagnosis not present

## 2020-11-13 HISTORY — DX: Abdominal aortic aneurysm, without rupture, unspecified: I71.40

## 2020-11-13 HISTORY — DX: Myoneural disorder, unspecified: G70.9

## 2020-11-13 HISTORY — DX: Abdominal aortic aneurysm, without rupture: I71.4

## 2020-11-13 HISTORY — DX: Aneurysm of the ascending aorta, without rupture: I71.21

## 2020-11-13 HISTORY — DX: Thoracic aortic aneurysm, without rupture: I71.2

## 2020-11-13 HISTORY — DX: Unspecified hearing loss, unspecified ear: H91.90

## 2020-11-13 LAB — BASIC METABOLIC PANEL WITH GFR
Anion gap: 7 (ref 5–15)
BUN: 19 mg/dL (ref 8–23)
CO2: 27 mmol/L (ref 22–32)
Calcium: 9.1 mg/dL (ref 8.9–10.3)
Chloride: 104 mmol/L (ref 98–111)
Creatinine, Ser: 1.31 mg/dL — ABNORMAL HIGH (ref 0.61–1.24)
GFR, Estimated: 59 mL/min — ABNORMAL LOW
Glucose, Bld: 94 mg/dL (ref 70–99)
Potassium: 4.2 mmol/L (ref 3.5–5.1)
Sodium: 138 mmol/L (ref 135–145)

## 2020-11-13 LAB — URINALYSIS, ROUTINE W REFLEX MICROSCOPIC
Bilirubin Urine: NEGATIVE
Glucose, UA: NEGATIVE mg/dL
Hgb urine dipstick: NEGATIVE
Ketones, ur: NEGATIVE mg/dL
Leukocytes,Ua: NEGATIVE
Nitrite: NEGATIVE
Protein, ur: NEGATIVE mg/dL
Specific Gravity, Urine: 1.019 (ref 1.005–1.030)
pH: 5 (ref 5.0–8.0)

## 2020-11-13 LAB — CBC
HCT: 43.4 % (ref 39.0–52.0)
Hemoglobin: 13.7 g/dL (ref 13.0–17.0)
MCH: 28.3 pg (ref 26.0–34.0)
MCHC: 31.6 g/dL (ref 30.0–36.0)
MCV: 89.7 fL (ref 80.0–100.0)
Platelets: 187 10*3/uL (ref 150–400)
RBC: 4.84 MIL/uL (ref 4.22–5.81)
RDW: 14 % (ref 11.5–15.5)
WBC: 8.2 10*3/uL (ref 4.0–10.5)
nRBC: 0 % (ref 0.0–0.2)

## 2020-11-13 LAB — PROTIME-INR
INR: 1 (ref 0.8–1.2)
Prothrombin Time: 13.5 seconds (ref 11.4–15.2)

## 2020-11-13 LAB — SURGICAL PCR SCREEN
MRSA, PCR: NEGATIVE
Staphylococcus aureus: NEGATIVE

## 2020-11-13 LAB — TYPE AND SCREEN
ABO/RH(D): A POS
Antibody Screen: NEGATIVE

## 2020-11-13 LAB — APTT: aPTT: 29 seconds (ref 24–36)

## 2020-11-13 LAB — SARS CORONAVIRUS 2 (TAT 6-24 HRS): SARS Coronavirus 2: NEGATIVE

## 2020-11-13 NOTE — Progress Notes (Signed)
Patient denies shortness of breath, fever, cough or chest pain.  PCP - Dr Lovette Cliche Cardiologist - Talbert Cage, NP Pain Management - Vanice Sarah, [place]  Chest x-ray - 11/13/20 EKG - 10/20/20 [facility] - Requested tracing Stress Test - 2013 ECHO - n/a Cardiac Cath - 10/11/19 [facility]  Sleep Study -  Yes CPAP - uses cpap nightly  Aspirin and Plavix  Instructions: Last dose was on 11/07/20 for both plavix and aspirin.  Anesthesia review: Yes  STOP now taking any Aspirin (unless otherwise instructed by your surgeon), Aleve, Naproxen, Ibuprofen, Motrin, Advil, Goody's, BC's, all herbal medications, fish oil, and all vitamins.   Coronavirus Screening Covid test at PAT appt. 11/13/20 Do you have any of the following symptoms:  Cough yes/no: No Fever (>100.52F)  yes/no: No Runny nose yes/no: No Sore throat yes/no: No Difficulty breathing/shortness of breath  yes/no: No  Have you traveled in the last 14 days and where? yes/no: No  Patient verbalized understanding of instructions that were given to them at the PAT appointment.

## 2020-11-14 ENCOUNTER — Encounter (HOSPITAL_COMMUNITY): Payer: Self-pay

## 2020-11-14 NOTE — Progress Notes (Addendum)
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  History includes smoking, COPD, HTN, HLD, CAD ("silent" MI ~ 2006; Xience DES RCA 2009; DES RCA & PTCA left PLA 12/25/16; PTCA CX, unable to stent 10/11/19), ascending thoracic aortic aneurysm (3.9 cm 07/25/20 CT), AAA (3.2 cm 09/21/20 CT), carotid artery stenosis (21-39% BICA stenosis 10/25/20), OSA (uses CPAP), hearing loss (uses hearing aids), chronic low back pain (with bilateral feet paresthesias), neck surgery, back surgery (L4-5 anterolateral fusion 10/21/13; spinal cord stimulator replacement Abbott/St. Jude 04/10/16).   He had cardiology evaluation 10/20/20 with Talbert Cage, NP for follow-up and preoperative evaluation. She wrote, "Preoperative Clearance Back surgery with Dr Rolena Infante scheduled May 4. He is requiring XLIF L3-4 Lateral Plate. The patient remains very active by walking daily and will go to the gym several times a week for additional exercise. I do not think he needs any further testing for his upcoming surgery. He may proceed with the proposed surgery at low risk from a cardiac standpoint."  Per H&P by Nelson Chimes, PA-C, " We received preoperative medical clearance from the patient's primary care provider and cardiologist. He will hold his Plavix and aspirin 7 days prior to surgery and we will plan to restart it 24-48 hours after surgery." He reported last doses 11/07/20.   11/13/20 presurgical COVID-19 test negative. Anesthesia team to evaluate on the day of surgery. 2022 EKG and carotid US  reports received. Cardiology office unable to get 2017 echo report since this was done prior to joining [facility]. Normal LVEF by 2021 cath.    VS: BP 119/80   Pulse 63   Temp (!) 36.4 C (Oral)   Resp 18   Ht 5' 10.5" (1.791 m)   Wt 81.3 kg   SpO2 100%   BMI 25.34 kg/m    PROVIDERS: Angelina Sheriff, MD is PCP  Talbert Cage, NP is cardiology provider Atilano Median, Collins Scotland, MD) [place], Legrand [place], [place] is Pain Management   LABS: Labs reviewed: Acceptable for surgery. (all labs ordered are listed, but only abnormal results are displayed)  Labs Reviewed  BASIC METABOLIC PANEL - Abnormal; Notable for the following components:      Result Value   Creatinine, Ser 1.31 (*)    GFR, Estimated 59 (*)    All other components within normal limits  SURGICAL PCR SCREEN  SARS CORONAVIRUS 2 (TAT 6-24 HRS)  CBC  PROTIME-INR  APTT  URINALYSIS, ROUTINE W REFLEX MICROSCOPIC  TYPE AND SCREEN     IMAGES: CXR 11/13/20: IMPRESSION: 1. No acute findings. 2. Chronic hyperinflation and bronchial thickening, consistent with known COPD.  CT L-spine 09/21/20: IMPRESSION: 1. Solid L4-5 and L5-S1 fusion with mild-to-moderate residual neural foraminal stenosis. No residual spinal stenosis. 2. Progressive adjacent segment disease at L3-4 with mild spinal stenosis and mild right and moderate left neural foraminal stenosis. 3. 3.2 cm infrarenal abdominal aortic aneurysm, mildly increased from 2018. Recommend follow-up ultrasound every 3 years. This recommendation follows ACR consensus guidelines: White Paper of the ACR Incidental Findings Committee  II on Vascular Findings. J Am Coll Radiol 2013; 10:789-794. 4. Bilateral nonobstructing nephrolithiasis. 5. Aortic Atherosclerosis (ICD10-I70.0).   CT Chest 07/25/20 [facility], see [place]/PACS): IMPRESSION: 1. Similar size of the thoracic aorta in comparison to most recent prior, with the ascending thoracic aorta at not measuring  overtly aneurysmal at 3.9 cm in greatest diameter. 2. Stable emphysematous changes and diffuse bronchial wall thickening. No suspicious pulmonary nodules or masses. 3. Emphysema and aortic atherosclerosis.   EKG: EKG 10/20/20 [facility]): Sinus rhythm Right bundle branch block Left anterior fascicular block Bifascicular block Possible lateral infarct, age undetermined When compared with ECG from 09/29/2019, no significant change was found Confirmed by Mathis Bud, MD on 10/20/2020 to 3:23:36 PM   CV: Carotid US 10/25/20 ([facility]): Impression: Right: 21 to 39% diameter reduction of internal carotid artery.  All Doppler velocities within normal limits.  No evidence of hemodynamically significant internal carotid artery stenosis.  The vertebral artery flow was antegrade and the subclavian artery was multiphasic. Left: 21 to 39% diameter reduction of internal carotid artery.  All Doppler velocities within normal limits.  No evidence of hemodynamically significant internal carotid artery stenosis.  The vertebral artery flow was antegrade and the subclavian artery was multiphasic.  Elevated velocities present in the external carotid artery.   Cardiac cath 10/11/19: Coronary Findings  Dominance: Right  Left Anterior Descending: Prox LAD to Mid LAD lesion is 30% stenosed. First Diagonal Branch: 1st Diag lesion is 30% stenosed.  Ramus Intermedius: Ramus lesion is 35% stenosed.  Left Circumflex: Prox Cx lesion is 30% stenosed. Prox Cx to Mid Cx lesion is 50% stenosed. Mid Cx to Dist Cx lesion is 95% stenosed. TIMI flow is 2.  Right Coronary Artery: Ost RCA to Prox RCA lesion is 40% stenosed. The lesion is eccentric.  Left Ventricle  The left ventricular size is normal. The left ventricular systolic function is normal. LV end diastolic pressure is normal. There are no wall motion abnormalities in the left ventricle. The outflow tract is normal. Left  ventriculography was normal with an EF of 60-70%.  Intervention  Mid Cx to Dist Cx lesion: Angioplasty: Angioplasty was performed independent of stent deployment. Post-Intervention Lesion Assessment: The intervention was successful. The guidewire crossed the lesion. Device was deployed. Post-intervention TIMI flow is 3. There were no complications. There is a 15% residual stenosis post intervention.   A PTCA with a 2.0 mm balloon was performed in the Distal circumflex. A  stent could not be advanced into the distal circumflex artery.   RECOMMENDATION:   Post PCI orders   Medical therapy for Coronary artery disease risk factor reduction. This  lesion was present at his last cath in 2009. It appeared slightly more  severe now. It seems unlikely this is a cause of his symptoms. If  however symptoms improve and then return with restenosis we can be more  aggressive in stenting the proximal circumflex, this would be required  before being able to place a stent distally.  Dual antiplatelet therapy with Prasugrel for at least 1 months is  warranted    Echo 08/17/15 ([facility]): Requested, but office unable to get results given it was done when they were with [facility].    Past Medical History:  Diagnosis Date  . AAA (abdominal aortic aneurysm) (HCC)    3.2 cm 09/21/20 CT  . Arthritis    "just about qwhere" (12/24/2016)  . CAD (coronary artery disease), native coronary artery    PCI-->  RCA '09  . Chronic lower back pain   . COPD (chronic obstructive pulmonary disease) ([place])   . Hearing loss    bialteral - wears hearing aids  . Hyperlipidemia   . Hypertension   . Neuromuscular disorder ([place])    bilateral feet - burning  . OSA on CPAP    uses cpap nightly  . Silent myocardial infarction [facility]) 2006   "found it when they were working me up for a back OR"  . Thoracic ascending aortic aneurysm (HCC)    3.9 cm 07/25/20 CT    Past Surgical History:  Procedure  Laterality Date  . ANTERIOR CERVICAL DECOMP/[place]CTOMY [place]US[place]  [place]  . ANTERIOR LAT LUMBAR FUSION N/A 10/21/2013   Procedure: ANTERIOR LATERAL LUMBAR FUSION 1 LEVEL(XLIF) L4-5;  Surgeon: Melina Schools, MD;  Location: [place];  Service: Orthopedics;  Laterality: N/A;  . ANTERIOR LUMBAR FUSION  2010  . BACK SURGERY    . CARDIAC CATHETERIZATION  2006   "[place]"  . CERVICAL DISC SURG[place]  [place]   [place]"  . COLONOSCOPY  2016  . CORONARY ANGIOPLASTY WITH STENT PLACEMENT  2009   [facility]  . CORONARY BALLOON ANGIOPLASTY N/A 12/25/2016   Procedure: Coronary Balloon Angioplasty;  Surgeon: Sherren Mocha, MD;  Location: [place] CV LAB;  Service: Cardiovascular;  Laterality: N/A;  . CORONARY STENT INTERVENTION N/A 12/25/2016   Procedure: Coronary Stent Intervention;  Surgeon: Sherren Mocha, MD;  Location: [place] CV LAB;  Service: Cardiovascular;  Laterality: N/A;  . INCISION AND DRAINAGE POSTERIOR LUMBAR SPINE  2010   "[employer]  . INGUINAL HERNIA REPAIR Bilateral ~ 2016  . LAPAROSCOPIC CHOLECYSTECTOMY    . LEFT HEART CATH AND CORONARY ANGIOGRAPHY N/A 12/25/2016   Procedure: Left Heart Cath and Coronary Angiography;  Surgeon: Sherren Mocha, MD;  Location: [place] CV LAB;  Service: Cardiovascular;  Laterality: N/A;  . LUMBAR [place] SURGERY  2006   "[facility]"  . NASAL SEPTUM SURGERY  2000s?  Marland Kitchen NASAL SINUS SURGERY  1990s?  Marland Kitchen POSTERIOR LUMBAR FUSION  2010; ~ 2012   "2 months after anterior fusion; another fusion"  . SPINAL CORD STIMULATOR IMPLANT  2012; 04/10/2016  . SPINAL CORD STIMULATOR REMOVAL  04/10/2016   "removed 1st stimulator"  . TONSILLECTOMY      MEDICATIONS: . hydrochlorothiazide (MICROZIDE) 12.5 MG capsule  . albuterol (PROVENTIL HFA;VENTOLIN HFA) 108 (90 Base) MCG/ACT inhaler  . aspirin EC 81 MG EC tablet  . Cholecalciferol 50 MCG (2000 UT) TABS  . clopidogrel (PLAVIX) 75 MG tablet  . hydrochlorothiazide (HYDRODIURIL) 25 MG tablet   . lisinopril (PRINIVIL,ZESTRIL) 40 MG tablet  . Melatonin 10 MG TABS  . metoprolol succinate (TOPROL-XL) 50 MG 24 hr tablet  . Multiple Vitamin (MULTIVITAMIN) capsule  . naloxone (NARCAN) nasal spray 4 mg/0.1 mL  . nitroGLYCERIN (NITROSTAT) 0.4 MG SL tablet  . Oxycodone HCl 10 MG TABS  . ranolazine (RANEXA) 500 MG 12 hr tablet  . rosuvastatin (CRESTOR) 20 MG tablet  . senna (SENOKOT) 8.6 MG TABS tablet  . umeclidinium-vilanterol (ANORO ELLIPTA) 62.5-25 MCG/INH AEPB   No current facility-administered medications for this encounter.    Myra Gianotti, PA-C Surgical Short Stay/Anesthesiology [facility] Phone [phone] [facility] Phone [phone] 11/14/2020 4:47 PM

## 2020-11-14 NOTE — Anesthesia Preprocedure Evaluation (Addendum)
Anesthesia Evaluation  Patient identified by MRN, date of birth, ID band Patient awake    Reviewed: Allergy & Precautions, NPO status , Patient's Chart, lab work & pertinent test results  Airway Mallampati: II  TM Distance: >3 FB Neck ROM: Full    Dental  (+) Edentulous Upper, Edentulous Lower, Dental Advisory Given   Pulmonary sleep apnea and Continuous Positive Airway Pressure Ventilation , COPD, Current Smoker,    Pulmonary exam normal breath sounds clear to auscultation       Cardiovascular Exercise Tolerance: Good hypertension, + CAD, + Past MI and + Cardiac Stents (on ASA/Plavix)  Normal cardiovascular exam Rhythm:Regular Rate:Normal  Thoracic ascending aortic aneurysm 3.9cm   Neuro/Psych Hearing loss - bilateral Spinal cord stimulator negative psych ROS   GI/Hepatic negative GI ROS, Neg liver ROS,   Endo/Other  hyperlipidemia  Renal/GU negative Renal ROS  negative genitourinary   Musculoskeletal  (+) Arthritis , Osteoarthritis,    Abdominal   Peds negative pediatric ROS (+)  Hematology negative hematology ROS (+)   Anesthesia Other Findings   Reproductive/Obstetrics negative OB ROS                         Anesthesia Physical Anesthesia Plan  ASA: III  Anesthesia Plan: General and Regional   Post-op Pain Management: GA combined w/ Regional for post-op pain   Induction: Intravenous  PONV Risk Score and Plan: 1 and Treatment may vary due to age or medical condition, Midazolam and Ondansetron  Airway Management Planned: Oral ETT  Additional Equipment: None  Intra-op Plan:   Post-operative Plan: Extubation in OR  Informed Consent: I have reviewed the patients History and Physical, chart, labs and discussed the procedure including the risks, benefits and alternatives for the proposed anesthesia with the patient or authorized representative who has indicated his/her  understanding and acceptance.       Plan Discussed with: CRNA and Anesthesiologist  Anesthesia Plan Comments: (PAT note written 11/14/2020 by Myra Gianotti, PA-C. )       Anesthesia Quick Evaluation

## 2020-11-15 ENCOUNTER — Inpatient Hospital Stay (HOSPITAL_COMMUNITY): Payer: Medicare Other | Admitting: Anesthesiology

## 2020-11-15 ENCOUNTER — Other Ambulatory Visit: Payer: Self-pay

## 2020-11-15 ENCOUNTER — Inpatient Hospital Stay (HOSPITAL_COMMUNITY)
Admission: RE | Disposition: A | Discharge status: Home / Self Care | Payer: Self-pay | Source: Ambulatory Visit | Attending: Orthopedic Surgery

## 2020-11-15 ENCOUNTER — Encounter (HOSPITAL_COMMUNITY): Payer: Self-pay | Admitting: Orthopedic Surgery

## 2020-11-15 ENCOUNTER — Inpatient Hospital Stay (HOSPITAL_COMMUNITY): Payer: Medicare Other

## 2020-11-15 ENCOUNTER — Inpatient Hospital Stay (HOSPITAL_COMMUNITY): Payer: Medicare Other | Admitting: Vascular Surgery

## 2020-11-15 ENCOUNTER — Inpatient Hospital Stay (HOSPITAL_COMMUNITY)
Admission: RE | Admit: 2020-11-15 | Discharge: 2020-11-16 | DRG: 460 | Disposition: A | Discharge status: Home / Self Care | Payer: Medicare Other | Attending: Orthopedic Surgery | Admitting: Orthopedic Surgery

## 2020-11-15 DIAGNOSIS — Z8249 Family history of ischemic heart disease and other diseases of the circulatory system: Secondary | ICD-10-CM | POA: Diagnosis not present

## 2020-11-15 DIAGNOSIS — M2578 Osteophyte, vertebrae: Secondary | ICD-10-CM | POA: Diagnosis not present

## 2020-11-15 DIAGNOSIS — M48061 Spinal stenosis, lumbar region without neurogenic claudication: Secondary | ICD-10-CM | POA: Diagnosis present

## 2020-11-15 DIAGNOSIS — G8929 Other chronic pain: Secondary | ICD-10-CM | POA: Diagnosis not present

## 2020-11-15 DIAGNOSIS — G4733 Obstructive sleep apnea (adult) (pediatric): Secondary | ICD-10-CM | POA: Diagnosis present

## 2020-11-15 DIAGNOSIS — Z419 Encounter for procedure for purposes other than remedying health state, unspecified: Secondary | ICD-10-CM

## 2020-11-15 DIAGNOSIS — F17210 Nicotine dependence, cigarettes, uncomplicated: Secondary | ICD-10-CM | POA: Diagnosis not present

## 2020-11-15 DIAGNOSIS — M5116 Intervertebral disc disorders with radiculopathy, lumbar region: Secondary | ICD-10-CM | POA: Diagnosis not present

## 2020-11-15 DIAGNOSIS — I2510 Atherosclerotic heart disease of native coronary artery without angina pectoris: Secondary | ICD-10-CM | POA: Diagnosis not present

## 2020-11-15 DIAGNOSIS — Z7982 Long term (current) use of aspirin: Secondary | ICD-10-CM

## 2020-11-15 DIAGNOSIS — I10 Essential (primary) hypertension: Secondary | ICD-10-CM | POA: Diagnosis not present

## 2020-11-15 DIAGNOSIS — Z20822 Contact with and (suspected) exposure to covid-19: Secondary | ICD-10-CM | POA: Diagnosis not present

## 2020-11-15 DIAGNOSIS — M545 Low back pain, unspecified: Secondary | ICD-10-CM | POA: Diagnosis not present

## 2020-11-15 DIAGNOSIS — Z9682 Presence of neurostimulator: Secondary | ICD-10-CM | POA: Diagnosis not present

## 2020-11-15 DIAGNOSIS — Z955 Presence of coronary angioplasty implant and graft: Secondary | ICD-10-CM | POA: Diagnosis not present

## 2020-11-15 DIAGNOSIS — Z7902 Long term (current) use of antithrombotics/antiplatelets: Secondary | ICD-10-CM | POA: Diagnosis not present

## 2020-11-15 DIAGNOSIS — M4326 Fusion of spine, lumbar region: Secondary | ICD-10-CM | POA: Diagnosis not present

## 2020-11-15 DIAGNOSIS — I712 Thoracic aortic aneurysm, without rupture: Secondary | ICD-10-CM | POA: Diagnosis present

## 2020-11-15 DIAGNOSIS — M1990 Unspecified osteoarthritis, unspecified site: Secondary | ICD-10-CM | POA: Diagnosis present

## 2020-11-15 DIAGNOSIS — Z981 Arthrodesis status: Secondary | ICD-10-CM

## 2020-11-15 DIAGNOSIS — Z7951 Long term (current) use of inhaled steroids: Secondary | ICD-10-CM

## 2020-11-15 DIAGNOSIS — J449 Chronic obstructive pulmonary disease, unspecified: Secondary | ICD-10-CM | POA: Diagnosis present

## 2020-11-15 DIAGNOSIS — Z79891 Long term (current) use of opiate analgesic: Secondary | ICD-10-CM

## 2020-11-15 DIAGNOSIS — Z79899 Other long term (current) drug therapy: Secondary | ICD-10-CM

## 2020-11-15 DIAGNOSIS — M961 Postlaminectomy syndrome, not elsewhere classified: Secondary | ICD-10-CM | POA: Diagnosis not present

## 2020-11-15 DIAGNOSIS — H9190 Unspecified hearing loss, unspecified ear: Secondary | ICD-10-CM | POA: Diagnosis present

## 2020-11-15 DIAGNOSIS — E782 Mixed hyperlipidemia: Secondary | ICD-10-CM | POA: Diagnosis not present

## 2020-11-15 DIAGNOSIS — I252 Old myocardial infarction: Secondary | ICD-10-CM

## 2020-11-15 HISTORY — PX: ANTERIOR LATERAL LUMBAR FUSION WITH PERCUTANEOUS SCREW 1 LEVEL: SHX5553

## 2020-11-15 SURGERY — ANTERIOR LATERAL LUMBAR FUSION WITH PERCUTANEOUS SCREW 1 LEVEL
Anesthesia: Regional | Site: Spine Lumbar

## 2020-11-15 MED ORDER — PROMETHAZINE HCL 25 MG/ML IJ SOLN: 6.2500 mg | INTRAMUSCULAR | Status: DC | PRN

## 2020-11-15 MED ORDER — OXYCODONE HCL 5 MG PO TABS: 5.0000 mg | ORAL_TABLET | ORAL | Status: DC | PRN

## 2020-11-15 MED ORDER — BUPIVACAINE HCL (PF) 0.5 % IJ SOLN
INTRAMUSCULAR | Status: DC | PRN
  Administered 2020-11-15: 20 mL

## 2020-11-15 MED ORDER — HYDROCHLOROTHIAZIDE 25 MG PO TABS
25.0000 mg | ORAL_TABLET | Freq: Every day | ORAL | Status: DC
  Administered 2020-11-15: 25 mg via ORAL
  Filled 2020-11-15 (×2): qty 1

## 2020-11-15 MED ORDER — ONDANSETRON HCL 4 MG PO TABS: 4.0000 mg | ORAL_TABLET | Freq: Three times a day (TID) | ORAL | 0 refills | Status: AC | PRN

## 2020-11-15 MED ORDER — KETAMINE HCL 50 MG/5ML IJ SOSY
PREFILLED_SYRINGE | INTRAMUSCULAR | Status: AC
  Filled 2020-11-15: qty 5

## 2020-11-15 MED ORDER — OXYCODONE HCL 5 MG PO TABS
10.0000 mg | ORAL_TABLET | ORAL | Status: DC | PRN
  Administered 2020-11-15 – 2020-11-16 (×3): 10 mg via ORAL
  Filled 2020-11-15 (×4): qty 2

## 2020-11-15 MED ORDER — MAGNESIUM CITRATE PO SOLN
1.0000 | Freq: Once | ORAL | Status: AC | PRN
  Administered 2020-11-15: 1 via ORAL
  Filled 2020-11-15: qty 296

## 2020-11-15 MED ORDER — DROPERIDOL 2.5 MG/ML IJ SOLN: 0.6250 mg | Freq: Once | INTRAMUSCULAR | Status: DC | PRN

## 2020-11-15 MED ORDER — MIDAZOLAM HCL 2 MG/2ML IJ SOLN: 1.0000 mg | Freq: Once | INTRAMUSCULAR | Status: AC

## 2020-11-15 MED ORDER — SUCCINYLCHOLINE CHLORIDE 200 MG/10ML IV SOSY
PREFILLED_SYRINGE | INTRAVENOUS | Status: DC | PRN
  Administered 2020-11-15: 120 mg via INTRAVENOUS

## 2020-11-15 MED ORDER — ACETAMINOPHEN 10 MG/ML IV SOLN
INTRAVENOUS | Status: AC
  Filled 2020-11-15: qty 100

## 2020-11-15 MED ORDER — NITROGLYCERIN 0.4 MG SL SUBL: 0.4000 mg | SUBLINGUAL_TABLET | SUBLINGUAL | Status: DC | PRN

## 2020-11-15 MED ORDER — DEXAMETHASONE SODIUM PHOSPHATE 10 MG/ML IJ SOLN
INTRAMUSCULAR | Status: DC | PRN
  Administered 2020-11-15: 10 mg via INTRAVENOUS

## 2020-11-15 MED ORDER — METHOCARBAMOL 1000 MG/10ML IJ SOLN
500.0000 mg | Freq: Four times a day (QID) | INTRAMUSCULAR | Status: DC | PRN
  Filled 2020-11-15: qty 5

## 2020-11-15 MED ORDER — SODIUM CHLORIDE 0.9% FLUSH: 3.0000 mL | Freq: Two times a day (BID) | INTRAVENOUS | Status: DC

## 2020-11-15 MED ORDER — LIDOCAINE 2% (20 MG/ML) 5 ML SYRINGE
INTRAMUSCULAR | Status: DC | PRN
  Administered 2020-11-15: 80 mg via INTRAVENOUS

## 2020-11-15 MED ORDER — FENTANYL CITRATE (PF) 100 MCG/2ML IJ SOLN
INTRAMUSCULAR | Status: AC
  Administered 2020-11-15: 50 ug via INTRAVENOUS
  Filled 2020-11-15: qty 2

## 2020-11-15 MED ORDER — BUPIVACAINE LIPOSOME 1.3 % IJ SUSP
INTRAMUSCULAR | Status: DC | PRN
  Administered 2020-11-15: 10 mL

## 2020-11-15 MED ORDER — ACETAMINOPHEN 10 MG/ML IV SOLN
INTRAVENOUS | Status: DC | PRN
  Administered 2020-11-15: 1000 mg via INTRAVENOUS

## 2020-11-15 MED ORDER — MORPHINE SULFATE (PF) 2 MG/ML IV SOLN
2.0000 mg | INTRAVENOUS | Status: DC | PRN
Start: 2020-11-15 — End: 2020-11-16

## 2020-11-15 MED ORDER — FENTANYL CITRATE (PF) 250 MCG/5ML IJ SOLN
INTRAMUSCULAR | Status: DC | PRN
  Administered 2020-11-15 (×2): 100 ug via INTRAVENOUS

## 2020-11-15 MED ORDER — CEFAZOLIN SODIUM-DEXTROSE 2-4 GM/100ML-% IV SOLN
INTRAVENOUS | Status: AC
  Filled 2020-11-15: qty 100

## 2020-11-15 MED ORDER — METHOCARBAMOL 500 MG PO TABS: 500.0000 mg | ORAL_TABLET | Freq: Three times a day (TID) | ORAL | 0 refills | Status: AC | PRN

## 2020-11-15 MED ORDER — PROPOFOL 500 MG/50ML IV EMUL
INTRAVENOUS | Status: DC | PRN
  Administered 2020-11-15: 50 ug/kg/min via INTRAVENOUS

## 2020-11-15 MED ORDER — CHLORHEXIDINE GLUCONATE 0.12 % MT SOLN
OROMUCOSAL | Status: AC
  Administered 2020-11-15: 15 mL via OROMUCOSAL
  Filled 2020-11-15: qty 15

## 2020-11-15 MED ORDER — ACETAMINOPHEN 325 MG PO TABS
650.0000 mg | ORAL_TABLET | ORAL | Status: DC | PRN
  Administered 2020-11-15: 650 mg via ORAL
  Filled 2020-11-15: qty 2

## 2020-11-15 MED ORDER — DOCUSATE SODIUM 100 MG PO CAPS
100.0000 mg | ORAL_CAPSULE | Freq: Two times a day (BID) | ORAL | Status: DC
  Administered 2020-11-15: 100 mg via ORAL
  Filled 2020-11-15 (×2): qty 1

## 2020-11-15 MED ORDER — UMECLIDINIUM-VILANTEROL 62.5-25 MCG/INH IN AEPB
1.0000 | INHALATION_SPRAY | Freq: Every day | RESPIRATORY_TRACT | Status: DC
  Filled 2020-11-15: qty 14

## 2020-11-15 MED ORDER — CHLORHEXIDINE GLUCONATE 0.12 % MT SOLN: 15.0000 mL | Freq: Once | OROMUCOSAL | Status: AC

## 2020-11-15 MED ORDER — OXYCODONE-ACETAMINOPHEN 10-325 MG PO TABS: 1.0000 | ORAL_TABLET | Freq: Four times a day (QID) | ORAL | 0 refills | Status: AC | PRN

## 2020-11-15 MED ORDER — BUPIVACAINE-EPINEPHRINE 0.25% -1:200000 IJ SOLN
INTRAMUSCULAR | Status: DC | PRN
  Administered 2020-11-15: 10 mL

## 2020-11-15 MED ORDER — SUCCINYLCHOLINE CHLORIDE 200 MG/10ML IV SOSY
PREFILLED_SYRINGE | INTRAVENOUS | Status: AC
  Filled 2020-11-15: qty 10

## 2020-11-15 MED ORDER — THROMBIN (RECOMBINANT) 20000 UNITS EX SOLR
CUTANEOUS | Status: AC
  Filled 2020-11-15: qty 20000

## 2020-11-15 MED ORDER — PROPOFOL 10 MG/ML IV BOLUS
INTRAVENOUS | Status: AC
  Filled 2020-11-15: qty 20

## 2020-11-15 MED ORDER — HYDROMORPHONE HCL 1 MG/ML IJ SOLN: 0.2500 mg | INTRAMUSCULAR | Status: DC | PRN

## 2020-11-15 MED ORDER — METOPROLOL SUCCINATE ER 50 MG PO TB24
50.0000 mg | ORAL_TABLET | Freq: Every day | ORAL | Status: DC
  Filled 2020-11-15 (×2): qty 1

## 2020-11-15 MED ORDER — PHENOL 1.4 % MT LIQD: 1.0000 | OROMUCOSAL | Status: DC | PRN

## 2020-11-15 MED ORDER — ACETAMINOPHEN 650 MG RE SUPP: 650.0000 mg | RECTAL | Status: DC | PRN

## 2020-11-15 MED ORDER — MENTHOL 3 MG MT LOZG: 1.0000 | LOZENGE | OROMUCOSAL | Status: DC | PRN

## 2020-11-15 MED ORDER — ONDANSETRON HCL 4 MG PO TABS: 4.0000 mg | ORAL_TABLET | Freq: Four times a day (QID) | ORAL | Status: DC | PRN

## 2020-11-15 MED ORDER — SODIUM CHLORIDE 0.9 % IV SOLN
1.0000 g | Freq: Three times a day (TID) | INTRAVENOUS | Status: AC
  Administered 2020-11-15 – 2020-11-16 (×2): 1 g via INTRAVENOUS
  Filled 2020-11-15 (×2): qty 10

## 2020-11-15 MED ORDER — CEFAZOLIN SODIUM-DEXTROSE 2-4 GM/100ML-% IV SOLN
2.0000 g | INTRAVENOUS | Status: AC
  Administered 2020-11-15: 2 g via INTRAVENOUS

## 2020-11-15 MED ORDER — ORAL CARE MOUTH RINSE: 15.0000 mL | Freq: Once | OROMUCOSAL | Status: AC

## 2020-11-15 MED ORDER — DEXAMETHASONE SODIUM PHOSPHATE 10 MG/ML IJ SOLN
INTRAMUSCULAR | Status: AC
  Filled 2020-11-15: qty 1

## 2020-11-15 MED ORDER — BUPIVACAINE-EPINEPHRINE (PF) 0.25% -1:200000 IJ SOLN
INTRAMUSCULAR | Status: AC
  Filled 2020-11-15: qty 30

## 2020-11-15 MED ORDER — OXYCODONE HCL 5 MG PO TABS: 5.0000 mg | ORAL_TABLET | Freq: Once | ORAL | Status: DC | PRN

## 2020-11-15 MED ORDER — GLYCOPYRROLATE PF 0.2 MG/ML IJ SOSY
PREFILLED_SYRINGE | INTRAMUSCULAR | Status: DC | PRN
  Administered 2020-11-15: .1 mg via INTRAVENOUS

## 2020-11-15 MED ORDER — HYDROCHLOROTHIAZIDE 12.5 MG PO CAPS: 12.5000 mg | ORAL_CAPSULE | Freq: Every day | ORAL | Status: DC

## 2020-11-15 MED ORDER — MIDAZOLAM HCL 2 MG/2ML IJ SOLN
INTRAMUSCULAR | Status: AC
  Filled 2020-11-15: qty 2

## 2020-11-15 MED ORDER — ALBUTEROL SULFATE (2.5 MG/3ML) 0.083% IN NEBU: 3.0000 mL | INHALATION_SOLUTION | Freq: Four times a day (QID) | RESPIRATORY_TRACT | Status: DC | PRN

## 2020-11-15 MED ORDER — RANOLAZINE ER 500 MG PO TB12
500.0000 mg | ORAL_TABLET | Freq: Two times a day (BID) | ORAL | Status: DC
  Administered 2020-11-15: 500 mg via ORAL
  Filled 2020-11-15 (×3): qty 1

## 2020-11-15 MED ORDER — FENTANYL CITRATE (PF) 250 MCG/5ML IJ SOLN
INTRAMUSCULAR | Status: AC
  Filled 2020-11-15: qty 5

## 2020-11-15 MED ORDER — OXYCODONE HCL 5 MG/5ML PO SOLN
5.0000 mg | Freq: Once | ORAL | Status: DC | PRN
Start: 2020-11-15 — End: 2020-11-15

## 2020-11-15 MED ORDER — THROMBIN 20000 UNITS EX SOLR
CUTANEOUS | Status: DC | PRN
  Administered 2020-11-15: 20000 [IU] via TOPICAL

## 2020-11-15 MED ORDER — LISINOPRIL 20 MG PO TABS
40.0000 mg | ORAL_TABLET | Freq: Every day | ORAL | Status: DC
  Administered 2020-11-15: 40 mg via ORAL
  Filled 2020-11-15 (×2): qty 2

## 2020-11-15 MED ORDER — LACTATED RINGERS IV SOLN: INTRAVENOUS | Status: DC

## 2020-11-15 MED ORDER — MIDAZOLAM HCL 5 MG/5ML IJ SOLN
INTRAMUSCULAR | Status: DC | PRN
  Administered 2020-11-15: 2 mg via INTRAVENOUS

## 2020-11-15 MED ORDER — ONDANSETRON HCL 4 MG/2ML IJ SOLN
INTRAMUSCULAR | Status: DC | PRN
  Administered 2020-11-15: 4 mg via INTRAVENOUS

## 2020-11-15 MED ORDER — MIDAZOLAM HCL 2 MG/2ML IJ SOLN
INTRAMUSCULAR | Status: AC
  Administered 2020-11-15: 1 mg via INTRAVENOUS
  Filled 2020-11-15: qty 2

## 2020-11-15 MED ORDER — ONDANSETRON HCL 4 MG/2ML IJ SOLN: 4.0000 mg | Freq: Four times a day (QID) | INTRAMUSCULAR | Status: DC | PRN

## 2020-11-15 MED ORDER — SODIUM CHLORIDE 0.9 % IV SOLN: 250.0000 mL | INTRAVENOUS | Status: DC

## 2020-11-15 MED ORDER — PROPOFOL 10 MG/ML IV BOLUS
INTRAVENOUS | Status: DC | PRN
  Administered 2020-11-15: 120 mg via INTRAVENOUS
  Administered 2020-11-15 (×2): 30 mg via INTRAVENOUS

## 2020-11-15 MED ORDER — KETAMINE HCL 10 MG/ML IJ SOLN
INTRAMUSCULAR | Status: DC | PRN
  Administered 2020-11-15: 20 mg via INTRAVENOUS

## 2020-11-15 MED ORDER — METHOCARBAMOL 500 MG PO TABS
500.0000 mg | ORAL_TABLET | Freq: Four times a day (QID) | ORAL | Status: DC | PRN
  Administered 2020-11-15 – 2020-11-16 (×2): 500 mg via ORAL
  Filled 2020-11-15 (×2): qty 1

## 2020-11-15 MED ORDER — SODIUM CHLORIDE 0.9% FLUSH: 3.0000 mL | INTRAVENOUS | Status: DC | PRN

## 2020-11-15 MED ORDER — POLYETHYLENE GLYCOL 3350 17 G PO PACK: 17.0000 g | PACK | Freq: Every day | ORAL | Status: DC | PRN

## 2020-11-15 MED ORDER — PHENYLEPHRINE HCL-NACL 10-0.9 MG/250ML-% IV SOLN
INTRAVENOUS | Status: DC | PRN
  Administered 2020-11-15: 25 ug/min via INTRAVENOUS

## 2020-11-15 MED ORDER — FENTANYL CITRATE (PF) 100 MCG/2ML IJ SOLN: 50.0000 ug | Freq: Once | INTRAMUSCULAR | Status: AC

## 2020-11-15 MED ORDER — LIDOCAINE 2% (20 MG/ML) 5 ML SYRINGE
INTRAMUSCULAR | Status: AC
  Filled 2020-11-15: qty 5

## 2020-11-15 MED ORDER — ONDANSETRON HCL 4 MG/2ML IJ SOLN
INTRAMUSCULAR | Status: AC
  Filled 2020-11-15: qty 2

## 2020-11-15 SURGICAL SUPPLY — 81 items
ADH SKN CLS APL DERMABOND .7 (GAUZE/BANDAGES/DRESSINGS)
AGENT HMST KT MTR STRL THRMB (HEMOSTASIS)
APPLIER CLIP 11 MED OPEN (CLIP)
APR CLP MED 11 20 MLT OPN (CLIP)
BLADE CLIPPER SURG (BLADE) IMPLANT
BLADE SURG 10 STRL SS (BLADE) ×2 IMPLANT
BOLT PLATE XLIF 5.5X60 LRG (Bolt) ×2 IMPLANT
CLIP APPLIE 11 MED OPEN (CLIP) IMPLANT
CORD BIPOLAR FORCEPS 12FT (ELECTRODE) ×2 IMPLANT
COVER SURGICAL LIGHT HANDLE (MISCELLANEOUS) ×2 IMPLANT
COVER WAND RF STERILE (DRAPES) ×2 IMPLANT
DERMABOND ADVANCED (GAUZE/BANDAGES/DRESSINGS)
DERMABOND ADVANCED .7 DNX12 (GAUZE/BANDAGES/DRESSINGS) ×1 IMPLANT
DRAPE C-ARM 42X72 X-RAY (DRAPES) ×2 IMPLANT
DRAPE ORTHO SPLIT 77X108 STRL (DRAPES) ×2
DRAPE POUCH INSTRU U-SHP 10X18 (DRAPES) ×2 IMPLANT
DRAPE SURG ORHT 6 SPLT 77X108 (DRAPES) ×1 IMPLANT
DRAPE U-SHAPE 47X51 STRL (DRAPES) ×4 IMPLANT
DRSG AQUACEL AG ADV 3.5X 6 (GAUZE/BANDAGES/DRESSINGS) ×1 IMPLANT
DURAPREP 26ML APPLICATOR (WOUND CARE) ×2 IMPLANT
ELECT BLADE 4.0 EZ CLEAN MEGAD (MISCELLANEOUS) ×2
ELECT CAUTERY BLADE 6.4 (BLADE) ×2 IMPLANT
ELECT PENCIL ROCKER SW 15FT (MISCELLANEOUS) ×2 IMPLANT
ELECT REM PT RETURN 9FT ADLT (ELECTROSURGICAL) ×2
ELECTRODE BLDE 4.0 EZ CLN MEGD (MISCELLANEOUS) ×1 IMPLANT
ELECTRODE REM PT RTRN 9FT ADLT (ELECTROSURGICAL) ×1 IMPLANT
GAUZE 4X4 16PLY RFD (DISPOSABLE) ×1 IMPLANT
GLOVE BIO SURGEON STRL SZ 6.5 (GLOVE) ×2 IMPLANT
GLOVE BIOGEL PI IND STRL 8.5 (GLOVE) ×1 IMPLANT
GLOVE BIOGEL PI INDICATOR 8.5 (GLOVE) ×1
GLOVE SS BIOGEL STRL SZ 8.5 (GLOVE) ×1 IMPLANT
GLOVE SUPERSENSE BIOGEL SZ 8.5 (GLOVE) ×1
GLOVE SURG UNDER POLY LF SZ6.5 (GLOVE) ×2 IMPLANT
GOWN STRL REUS W/ TWL LRG LVL3 (GOWN DISPOSABLE) ×1 IMPLANT
GOWN STRL REUS W/TWL 2XL LVL3 (GOWN DISPOSABLE) ×3 IMPLANT
GOWN STRL REUS W/TWL LRG LVL3 (GOWN DISPOSABLE) ×4
KIT BASIN OR (CUSTOM PROCEDURE TRAY) ×2 IMPLANT
KIT DILATOR XLIF 5 (KITS) IMPLANT
KIT SURGICAL ACCESS MAXCESS 4 (KITS) ×1 IMPLANT
KIT TURNOVER KIT B (KITS) ×2 IMPLANT
KIT XLIF (KITS) ×1
MIX DBX 10CC 35% BONE (Bone Implant) ×1 IMPLANT
MODULE EMG NDL SSEP NVM5 (NEEDLE) IMPLANT
MODULE EMG NEEDLE SSEP NVM5 (NEEDLE) ×2 IMPLANT
MODULE NVM5 NEXT GEN EMG (NEEDLE) ×1 IMPLANT
MODULUS XLW 12X22X55MM 10 (Spine Construct) ×1 IMPLANT
NDL I-PASS III (NEEDLE) IMPLANT
NDL SPNL 18GX3.5 QUINCKE PK (NEEDLE) ×1 IMPLANT
NEEDLE 22X1 1/2 (OR ONLY) (NEEDLE) ×2 IMPLANT
NEEDLE I-PASS III (NEEDLE) IMPLANT
NEEDLE SPNL 18GX3.5 QUINCKE PK (NEEDLE) IMPLANT
NS IRRIG 1000ML POUR BTL (IV SOLUTION) ×2 IMPLANT
PACK LAMINECTOMY ORTHO (CUSTOM PROCEDURE TRAY) ×2 IMPLANT
PACK UNIVERSAL I (CUSTOM PROCEDURE TRAY) ×2 IMPLANT
PAD ARMBOARD 7.5X6 YLW CONV (MISCELLANEOUS) ×4 IMPLANT
PIN FIXATION XLIF DECADE (PIN) ×1 IMPLANT
PLATE DECADE XLIF 2H SZ14 (Plate) ×1 IMPLANT
PUTTY BONE DBX 5CC MIX (Putty) ×1 IMPLANT
SPONGE INTESTINAL PEANUT (DISPOSABLE) ×2 IMPLANT
SPONGE LAP 4X18 RFD (DISPOSABLE) ×2 IMPLANT
SPONGE SURGIFOAM ABS GEL 100 (HEMOSTASIS) ×1 IMPLANT
STRIP CLOSURE SKIN 1/2X4 (GAUZE/BANDAGES/DRESSINGS) IMPLANT
SURGIFLO W/THROMBIN 8M KIT (HEMOSTASIS) IMPLANT
SUT BONE WAX W31G (SUTURE) ×1 IMPLANT
SUT MNCRL AB 3-0 PS2 27 (SUTURE) ×2 IMPLANT
SUT PROLENE 5 0 C 1 24 (SUTURE) IMPLANT
SUT SILK 2 0 TIES 10X30 (SUTURE) ×1 IMPLANT
SUT SILK 3 0 TIES 10X30 (SUTURE) ×1 IMPLANT
SUT VIC AB 1 CT1 27 (SUTURE) ×2
SUT VIC AB 1 CT1 27XBRD ANBCTR (SUTURE) ×2 IMPLANT
SUT VIC AB 1 CTX 36 (SUTURE)
SUT VIC AB 1 CTX36XBRD ANBCTR (SUTURE) ×2 IMPLANT
SUT VIC AB 2-0 CT1 18 (SUTURE) ×3 IMPLANT
SYR BULB IRRIG 60ML STRL (SYRINGE) ×2 IMPLANT
SYR CONTROL 10ML LL (SYRINGE) ×2 IMPLANT
TAPE CLOTH 4X10 WHT NS (GAUZE/BANDAGES/DRESSINGS) ×4 IMPLANT
TOWEL GREEN STERILE (TOWEL DISPOSABLE) ×2 IMPLANT
TOWEL GREEN STERILE FF (TOWEL DISPOSABLE) ×2 IMPLANT
TRAY FOLEY W/BAG SLVR 16FR (SET/KITS/TRAYS/PACK)
TRAY FOLEY W/BAG SLVR 16FR ST (SET/KITS/TRAYS/PACK) ×1 IMPLANT
WATER STERILE IRR 1000ML POUR (IV SOLUTION) ×1 IMPLANT

## 2020-11-15 NOTE — Progress Notes (Signed)
Received call from Dr. Lin Landsman, radiologist, no evidence of retained foreign body seen on images.

## 2020-11-15 NOTE — Anesthesia Procedure Notes (Signed)
Anesthesia Regional Block: Quadratus lumborum   Pre-Anesthetic Checklist: ,, timeout performed, Correct Patient, Correct Site, Correct Laterality, Correct Procedure, Correct Position, site marked, Risks and benefits discussed,  Surgical consent,  Pre-op evaluation,  At surgeon's request and post-op pain management  Laterality: Left  Prep: chloraprep       Needles:  Injection technique: Single-shot  Needle Type: Echogenic Stimulator Needle          Additional Needles:   Procedures:,,,, ultrasound used (permanent image in chart),,,,  Narrative:  Start time: 11/15/2020 12:05 PM End time: 11/15/2020 12:10 PM Injection made incrementally with aspirations every 5 mL.  Performed by: Personally  Anesthesiologist: Merlinda Frederick, MD  Additional Notes: A functioning IV was confirmed and monitors were applied.  Sterile prep and drape, hand hygiene and sterile gloves were used.  Negative aspiration and test dose prior to incremental administration of local anesthetic. The patient tolerated the procedure well.Ultrasound  guidance: relevant anatomy identified, needle position confirmed, local anesthetic spread visualized, vascular puncture avoided.  Image printed for medical record.

## 2020-11-15 NOTE — Anesthesia Postprocedure Evaluation (Signed)
Anesthesia Post Note  Patient: Nathan Pham  Procedure(s) Performed: ANTERIOR LATERAL LUMBAR FUSION WITH PERCUTANEOUS SCREW 1 LEVEL (XLIF L3-4 with lateral plate (N/A Spine Lumbar)     Patient location during evaluation: PACU Anesthesia Type: Regional and General Level of consciousness: sedated Pain management: pain level controlled Vital Signs Assessment: post-procedure vital signs reviewed and stable Respiratory status: spontaneous breathing and respiratory function stable Cardiovascular status: stable Postop Assessment: no apparent nausea or vomiting Anesthetic complications: no   No complications documented.  Last Vitals:  Vitals:   11/15/20 1603 11/15/20 1641  BP: (!) 149/82 (!) 136/91  Pulse: (!) 51 72  Resp: 13 20  Temp: 36.7 C 36.6 C  SpO2: 97% 100%    Last Pain:  Vitals:   11/15/20 1645  TempSrc:   PainSc: 6                  Candra R Renardo Cheatum

## 2020-11-15 NOTE — Discharge Instructions (Signed)

## 2020-11-15 NOTE — Brief Op Note (Signed)
11/15/2020  3:01 PM  PATIENT:  Nathan Pham  70 y.o. 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, prior L4-S1 fusion  PROCEDURE:  Procedure(s) with comments: ANTERIOR LATERAL LUMBAR FUSION WITH PERCUTANEOUS SCREW 1 LEVEL (XLIF L3-4 with lateral plate (N/A) - 3.5 hrs Left sided tap block with exparel  SURGEON:  Surgeon(s) and Role:    Melina Schools, MD - Primary  PHYSICIAN ASSISTANT: Nelson Chimes, PA  ANESTHESIA:   general  EBL:  100 mL   BLOOD ADMINISTERED:none  DRAINS: none   LOCAL MEDICATIONS USED:  MARCAINE     SPECIMEN:  No Specimen  DISPOSITION OF SPECIMEN:  N/A  COUNTS:  YES  TOURNIQUET:  * No tourniquets in log *  DICTATION: .Dragon Dictation  PLAN OF CARE: Admit to inpatient   PATIENT DISPOSITION:  PACU - hemodynamically stable.

## 2020-11-15 NOTE — CV Procedure (Signed)
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  Status post L4-S1 fusion.  POST-OPERATIVE DIAGNOSIS: Same  PROCEDURE:   Lateral interbody fusion with application of separate lateral plate L3-4  SURGEON:  Melina Schools, MD  PHYSICIAN ASSISTANT: Nelson Chimes, PA  ANESTHESIA:   General  EBL: 100 ml  NuVasive interbody cage.  12 x 22 x 55 mm (10 degree lordotic).   14 mm decade plate affixed with 5.5 x 60 mm bolts.  Neuro monitoring: No abnormal SSEP or free running EMG activity noted.  Allograft: DBX mix  BRIEF HISTORY: [name] is a 70 y.o. male who has had a prior L4-S1 instrumented fusion as well as a spinal cord stimulator placed.  He was doing well until recently when he started having increasing back pain.  Imaging studies confirmed adjacent segment degenerative disc disease at L3-4.  Attempts at conservative management were unable to control his pain or improve his quality of life.  As result we elected to move forward with surgery.  All appropriate risks, benefits, alternatives to surgery were discussed and consent was obtained.  PROCEDURE DETAILS: Patient was brought into the operating room. After successful induction of general anesthesia and endotracheal intubation a Time Out was done. This confirmed all pertinent important data.  Patient was turned into the lateral decubitus position of the L3-4 disc space in both the AP and lateral plane.  And an axillary roll as well as padding provided to all bony prominences.  The patient was then directly secured to the table with tape to prevent motion.  Using fluoroscopy I confirmed satisfactory positioning and visualization of the L3-4 disc space.  The patient was then prepped and draped in a standard fashion.  The neuro monitoring representative did  place all appropriate monitoring devices for SSEP and free running EMG monitoring.  Lateral fluoroscopy was used to identify the borders of my incision which was then infiltrated with quarter percent Marcaine with epinephrine a transverse vision was made centered over the L3-4 disc space and I sharply dissected down to expose the fascia of the external oblique second incision was made posteriorly and my finger was advanced down to the posterior aspect of the retroperitoneal I bluntly dissected into the retroperitoneum and then I was able to bluntly dissect and prepared the space of in the retroperitoneum.  I then dissected through the undersurface of the oblique muscle until I could see my finger in the lateral incision and then placed the first relating to my finger and brought it down to the lateral aspect of the psoas.  Once I confirm that I was not traumatizing the lumbar plexus and I was properly positioned over the midportion of the disc space I placed a guidepin through this dilator to secure it to the disc space I then probed through the psoas down to the lateral aspect of the disc.  I then sequentially stimulated the psoas to confirm there was no abnormal free running EMG activity.  I then sequentially dilated up and with each 1 circumferentially stimulated.  Once the final dilator was then placed I placed the retractor and removed the dilators.  I then gently move the posterior blade posteriorly keeping a thick cuff of the psoas muscle posterior to the blade to prevent injury to the plexus.  Once I confirmed satisfactory positioning of the posterior  blade in the lateral and AP planes I then secured it to the disc space with a shim.  I then stimulated behind each the blades to confirm that there is no abnormal activity or compression to the lumbar plexus.  With the lateral aspect of the disc now clearly visualized I performed an annulotomy with a blade scalpel.  Using pituitary rongeurs, curettes, box  osteotomes, and Kerrison rongeurs I removed removed all the disc material and released the contralateral annulus.  I was able to remove all the cartilaginous endplate to expose the bleeding subchondral bone.  Once I removed all the disc material I then used the sequential and trialing devices and started with the 8 mm parallel.  The 12 mm lordotic implant provided the best overall fit and restoration of disc space height.  The implant was obtained and packed with DBX mix.  I then malleted the implant to the appropriate depth.  Final position was confirmed with both AP and lateral fluoroscopy views.  Once the implant was properly seated and then placed the remainder of the DBX mix graft anterior to the cage in order to aid in the overall fusion rate.  The lateral osteophytes were taken down with a rondure and the lateral plate was secured.  I confirmed satisfactory position.  I then used my temporary locking pin to secure the plate to the body of L3 and then advanced the awl just inferior to the pedicle screw in the body of L4 and down to the lateral side of the L4 vertebral body.  A 60 mm locking screw was then placed.  A second 60 mm screw was placed into the body of L3.  At this point the plate was well fixed.  Both screws had excellent purchase.  Once the screws were fully engaged with bicortical purchase I then used the locking device to lock the screws according manufacture standards.  The bed was then returned into the neutral position and the plate was locked in place.  Final x-rays were taken after the retractors were removed.  Hemostasis was obtained and the wound was copiously irrigated with normal saline.  The final x-rays were reviewed by the radiologist and cleared indicating there is no surgical instruments still in the wound.  The fascia of the external oblique was closed with interrupted #1 Vicryl suture, 2-0 Vicryl suture, and then 3-0 Monocryl.  The posterior small incision was closed in a similar  fashion.  Steri-Strips and a dry dressing were applied and the patient was ultimately extubated and transferred the PACU without incident.  The end of the case all needle sponge counts were correct.  There were no adverse intraoperative events.                                                                     Melina Schools, MD 11/15/2020 2:21 PM

## 2020-11-15 NOTE — Transfer of Care (Signed)
Immediate Anesthesia Transfer of Care Note  Patient: Nathan Pham  Procedure(s) Performed: ANTERIOR LATERAL LUMBAR FUSION WITH PERCUTANEOUS SCREW 1 LEVEL (XLIF L3-4 with lateral plate (N/A Spine Lumbar)  Patient Location: PACU  Anesthesia Type:General  Level of Consciousness: oriented, drowsy and patient cooperative  Airway & Oxygen Therapy: Patient Spontanous Breathing and Patient connected to face mask oxygen  Post-op Assessment: Report given to RN and Post -op Vital signs reviewed and stable  Post vital signs: Reviewed  Last Vitals:  Vitals Value Taken Time  BP 142/76 11/15/20 1503  Temp    Pulse 61 11/15/20 1508  Resp 15 11/15/20 1508  SpO2 99 % 11/15/20 1508  Vitals shown include unvalidated device data.  Last Pain:  Vitals:   11/15/20 1141  TempSrc:   PainSc: 4       Patients Stated Pain Goal: 3 (19/62/22 9798)  Complications: No complications documented.

## 2020-11-15 NOTE — Anesthesia Procedure Notes (Signed)

## 2020-11-15 NOTE — H&P (Signed)
Addendum H&P  There has been no change in patient's clinical exam since his last office note of 11/03/2020.  Continues to have significant back buttock and neuropathic leg pain.  Patient had previous L4-S1 fusion as well as a spinal cord stimulator placement and unfortunately still has significant back pain.  Imaging studies confirm adjacent segment L3-4 degeneration.  Plan on moving forward with an L3-4 fusion with lateral plate fixation.  I reviewed the risks, benefits, alternatives to surgery with the patient in great detail and he is expressed a willingness to move forward as well as an understanding of the risks and benefits.

## 2020-11-16 ENCOUNTER — Encounter (HOSPITAL_COMMUNITY): Payer: Self-pay | Admitting: Orthopedic Surgery

## 2020-11-16 MED FILL — Thrombin (Recombinant) For Soln 20000 Unit: CUTANEOUS | Qty: 1 | Status: AC

## 2020-11-16 NOTE — Evaluation (Signed)
Physical Therapy Evaluation & Discharge Patient Details Name: Nathan Pham MRN: [id number] DOB: 01-Apr-1951 Today's Date: 11/16/2020   History of Present Illness  70 y/o male s/p ALIF L3-4 on 5/4. PMH: hyperlipemia, CAD, COPD, HTN  Clinical Impression  PTA, patient lives with son and daughter in law and reports independence except use of SPC for community distances. Patient functioning at [place] level for mobility. Educated patient on back precautions and provided handout. Educated on progressive walking program upon discharge. No further skilled PT needs required acutely. No PT follow up recommended at this time. PT will sign off.     Follow Up Recommendations No PT follow up    Equipment Recommendations  None recommended by PT    Recommendations for Other Services       Precautions / Restrictions Precautions Precautions: Back Precaution Booklet Issued: Yes (comment) Required Braces or Orthoses: Spinal Brace Spinal Brace: Lumbar corset Restrictions Weight Bearing Restrictions: No      Mobility  Bed Mobility               General bed mobility comments: ambulating in room on arrival    Transfers Overall transfer level: Modified independent                  Ambulation/Gait Ambulation/Gait assistance: Modified independent (Device/Increase time) Gait Distance (Feet): 300 Feet Assistive device: Straight cane Gait Pattern/deviations: WFL(Within Functional Limits)   Gait velocity interpretation: >2.62 ft/sec, indicative of community ambulatory General Gait Details: carried [facility] with him but rarely used appropriately.  Stairs Stairs: Yes Stairs assistance: Modified independent (Device/Increase time) Stair Management: No rails;Step to pattern;Forwards Number of Stairs: 1    Wheelchair Mobility    Modified Rankin (Stroke Patients Only)       Balance Overall balance assessment: Mild deficits observed, not formally tested                                            Pertinent Vitals/Pain Pain Assessment: Faces Faces Pain Scale: No hurt Pain Intervention(s): Monitored during session    Home Living Family/patient expects to be discharged to:: Private residence Living Arrangements: Children;Other relatives Available Help at Discharge: Family;Available PRN/intermittently Type of Home: House Home Access: Stairs to enter Entrance Stairs-Rails: None Entrance Stairs-Number of Steps: 1 Home Layout: One level Home Equipment: Adarryl Goldammer - 2 wheels;Cambell Stanek - 4 wheels;Cane - single point      Prior Function Level of Independence: Independent         Comments: carries SPC for community distance in case he feels a "shock". Reports 2 falls in past 3 months     Hand Dominance        Extremity/Trunk Assessment   Upper Extremity Assessment Upper Extremity Assessment: Defer to OT evaluation    Lower Extremity Assessment Lower Extremity Assessment: Overall WFL for tasks assessed       Communication   Communication: No difficulties  Cognition Arousal/Alertness: Awake/alert Behavior During Therapy: WFL for tasks assessed/performed Overall Cognitive Status: Within Functional Limits for tasks assessed                                        General Comments      Exercises     Assessment/Plan    PT Assessment Patent does not need any further  PT services  PT Problem List         PT Treatment Interventions      PT Goals (Current goals can be found in the Care Plan section)  Acute Rehab PT Goals Patient Stated Goal: to go home PT Goal Formulation: All assessment and education complete, DC therapy    Frequency     Barriers to discharge        Co-evaluation               AM-PAC PT "6 Clicks" Mobility  Outcome Measure Help needed turning from your back to your side while in a flat bed without using bedrails?: None Help needed moving from lying on your back to sitting on the side of a  flat bed without using bedrails?: None Help needed moving to and from a bed to a chair (including a wheelchair)?: None Help needed standing up from a chair using your arms (e.g., wheelchair or bedside chair)?: None Help needed to walk in hospital room?: None Help needed climbing 3-5 steps with a railing? : None 6 Click Score: 24    End of Session Equipment Utilized During Treatment: Back brace Activity Tolerance: Patient tolerated treatment well Patient left: Other (comment) (ambulating in room) Nurse Communication: Mobility status PT Visit Diagnosis: Unsteadiness on feet (R26.81);History of falling (Z91.81)    Time: 7416-3845 PT Time Calculation (min) (ACUTE ONLY): 8 min   Charges:   PT Evaluation $PT Eval Low Complexity: 1 Low          Rilley Poulter A. Gilford Rile PT, DPT Acute Rehabilitation Services Pager [phone] Office [phone]   Linna Hoff 11/16/2020, 9:01 AM

## 2020-11-16 NOTE — Evaluation (Signed)
Occupational Therapy Evaluation Patient Details Name: Nathan Pham MRN: [id number] DOB: May 13, 1951 Today's Date: 11/16/2020    History of Present Illness 70 yo male presented for increasng LBP and bilateral radicular thigh pain.  PMH includes: Hyperlipemia, CAD, Chest pain 12/24/2016, Back Surgeries.  Patient underwent ANTERIOR LATERAL LUMBAR FUSION WITH PERCUTANEOUS SCREW 1 LEVEL (XLIF L3-4 with lateral plate.   Clinical Impression   Patient admitted for the diagnosis and procedure above.  PTA he lives with his daughter and SIL who are able to assist as needed.  Patient complains of minor discomfort to his R leg, but otherwise feels good and is ready to return home.  Currently is is functioning at or near his baseline for all ADL and mobility.  No acute or post acute OT needs identified.      Follow Up Recommendations  No OT follow up    Equipment Recommendations  None recommended by OT    Recommendations for Other Services       Precautions / Restrictions Precautions Precautions: Back Precaution Booklet Issued: Yes (comment) Required Braces or Orthoses: Spinal Brace Spinal Brace: Lumbar corset Restrictions Weight Bearing Restrictions: No      Mobility Bed Mobility               General bed mobility comments: ambulating in room on arrival    Transfers Overall transfer level: Modified independent                    Balance Overall balance assessment: Mild deficits observed, not formally tested                                         ADL either performed or assessed with clinical judgement   ADL Overall ADL's : At baseline                                       General ADL Comments: Patient able to dress himself with out assist from modified sit/stand level.  Reviewed figure four and bathing/dressing from a seated position.     Vision Patient Visual Report: No change from baseline       Perception     Praxis       Pertinent Vitals/Pain Pain Assessment: 0-10 Pain Score: 3  Faces Pain Scale: No hurt Pain Location: Radiating R thigh pain, resolves with mobility. Pain Descriptors / Indicators: Nagging Pain Intervention(s): Monitored during session     Hand Dominance Right   Extremity/Trunk Assessment Upper Extremity Assessment Upper Extremity Assessment: Overall WFL for tasks assessed   Lower Extremity Assessment Lower Extremity Assessment: Defer to PT evaluation   Cervical / Trunk Assessment Cervical / Trunk Assessment: Other exceptions Cervical / Trunk Exceptions: spinal surgery   Communication Communication Communication: No difficulties   Cognition Arousal/Alertness: Awake/alert Behavior During Therapy: WFL for tasks assessed/performed Overall Cognitive Status: Within Functional Limits for tasks assessed                                                      Home Living Family/patient expects to be discharged to:: Private residence Living Arrangements: Children;Other relatives Available Help at Discharge:  Family;Available PRN/intermittently Type of Home: House Home Access: Stairs to enter [employer] of Steps: 1 Entrance Stairs-Rails: None Home Layout: One level     Bathroom Shower/Tub: Tub/shower unit         Home Equipment: Environmental consultant - 2 wheels;Walker - 4 wheels;Cane - single point;Adaptive equipment Adaptive Equipment: Reacher;Long-handled sponge        Prior Functioning/Environment Level of Independence: Independent        Comments: occasional use of SPC        OT Problem List: Pain      OT Treatment/Interventions:      OT Goals(Current goals can be found in the care plan section) Acute Rehab OT Goals Patient Stated Goal: Return home and recover OT Goal Formulation: With patient Time For Goal Achievement: 11/16/20 Potential to Achieve Goals: Good  OT Frequency:     Barriers to D/C:  none noted           Co-evaluation              AM-PAC OT "6 Clicks" Daily Activity     Outcome Measure Help from another person eating meals?: None Help from another person taking care of personal grooming?: None Help from another person toileting, which includes using toliet, bedpan, or urinal?: None Help from another person bathing (including washing, rinsing, drying)?: None Help from another person to put on and taking off regular upper body clothing?: None Help from another person to put on and taking off regular lower body clothing?: None 6 Click Score: 24   End of Session Equipment Utilized During Treatment: Back brace  Activity Tolerance: Patient tolerated treatment well Patient left: Other (comment) (walking the hall)  OT Visit Diagnosis: Pain Pain - Right/Left: Right Pain - part of body: Leg                Time: 1517-6160 OT Time Calculation (min): 12 min Charges:  OT General Charges $OT Visit: 1 Visit OT Evaluation $OT Eval Moderate Complexity: 1 Mod  11/16/2020  Rich, OTR/L  Acute Rehabilitation Services  Office:  [place] 11/16/2020, 9:08 AM

## 2020-11-16 NOTE — Progress Notes (Signed)
Subjective: 1 Day Post-Op Procedure(s) (LRB): ANTERIOR LATERAL LUMBAR FUSION WITH PERCUTANEOUS SCREW 1 LEVEL (XLIF L3-4 with lateral plate (N/A) Patient reports pain as mild.  Mainly complaining of left iliopsoas pain. Radicular leg pain improved. No significant incisional or back pain.  +ambulation +flatus, +BM +void, feels as though he is emptying his bladder completely. Denies CP, calf pain, SOB  Objective: Vital signs in last 24 hours: Temp:  [97.6 F (36.4 C)-98.3 F (36.8 C)] 98 F (36.7 C) (05/05 0730) Pulse Rate:  [51-72] 62 (05/05 0730) Resp:  [11-20] 17 (05/05 0730) BP: (105-149)/(67-97) 105/67 (05/05 0730) SpO2:  [94 %-100 %] 99 % (05/05 0730) Weight:  [81.3 kg] 81.3 kg (05/04 1119)  Intake/Output from previous day: 05/04 0701 - 05/05 0700 In: 1640 [P.O.:240; I.V.:1000; IV Piggyback:400] Out: 100 [Blood:100] Intake/Output this shift: No intake/output data recorded.  Recent Labs    11/13/20 1039  HGB 13.7   Recent Labs    11/13/20 1039  WBC 8.2  RBC 4.84  HCT 43.4  PLT 187   Recent Labs    11/13/20 1039  NA 138  K 4.2  CL 104  CO2 27  BUN 19  CREATININE 1.31*  GLUCOSE 94  CALCIUM 9.1   Recent Labs    11/13/20 1039  INR 1.0    Neurologically intact ABD soft Neurovascular intact Sensation intact distally Intact pulses distally Dorsiflexion/Plantar flexion intact Incision: dressing C/D/I and scant drainage No cellulitis present Compartment soft   Assessment/Plan: 1 Day Post-Op Procedure(s) (LRB): ANTERIOR LATERAL LUMBAR FUSION WITH PERCUTANEOUS SCREW 1 LEVEL (XLIF L3-4 with lateral plate (N/A) Advance diet Up with therapy Encourage IS DVT ppx: Teds, SCDs, ambulation. Restart Plavix and ASA tomorrow.  Plan D/C home this am after PT.   F/u with Dr. Rolena Infante in 2 weeks.      Charlyne Petrin 11/16/2020, 7:57 AM

## 2020-11-16 NOTE — Progress Notes (Signed)
Patient is discharged from room 3C10 at this time. Alert and in stable condition. IV sit d/c'd and instructions read to patient with understanding verbalized and all questions answered. Left unit via wheelchair with all belongings at side.

## 2020-11-17 NOTE — Discharge Summary (Signed)
Patient ID: Nathan Pham MRN: [id number] DOB/AGE: [birth year] 70 y.o.  Admit date: 11/15/2020 Discharge date: 11/17/2020  Admission Diagnoses:  Active Problems:   S/P lumbar fusion   Discharge Diagnoses:  Active Problems:   S/P lumbar fusion  status post Procedure(s): ANTERIOR LATERAL LUMBAR FUSION WITH PERCUTANEOUS SCREW 1 LEVEL (XLIF L3-4 with lateral plate  Past Medical History:  Diagnosis Date  . AAA (abdominal aortic aneurysm) (HCC)    3.2 cm 09/21/20 CT  . Arthritis    "just about qwhere" (12/24/2016)  . CAD (coronary artery disease), native coronary artery    PCI--> RCA '09  . Chronic lower back pain   . COPD (chronic obstructive pulmonary disease) ([place])   . Hearing loss    bialteral - wears hearing aids  . Hyperlipidemia   . Hypertension   . Neuromuscular disorder ([place])    bilateral feet - burning  . OSA on CPAP    uses cpap nightly  . Silent myocardial infarction [facility]) 2006   "found it when they were working me up for a back OR"  . Thoracic ascending aortic aneurysm (HCC)    3.9 cm 07/25/20 CT    Surgeries: Procedure(s): ANTERIOR LATERAL LUMBAR FUSION WITH PERCUTANEOUS SCREW 1 LEVEL (XLIF L3-4 with lateral plate on 02/13/7077   Consultants:   Discharged Condition: Improved  Hospital Course: Nathan Pham is an 70 y.o. 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 Patient failed conservative treatments (please see the history and physical for the specifics) and had severe unremitting pain that affects sleep, daily activities and work/hobbies. After pre-op clearance, the patient was taken to the operating room on 11/15/2020 and underwent  Procedure(s): ANTERIOR LATERAL LUMBAR FUSION WITH PERCUTANEOUS SCREW 1 LEVEL (XLIF L3-4 with lateral plate.    Patient was given perioperative antibiotics:  Anti-infectives (From admission, onward)   Start     Dose/Rate Route Frequency  Ordered Stop   11/15/20 1730  ceFAZolin (ANCEF) 1 g in sodium chloride 0.9 % 100 mL IVPB        1 g 200 mL/hr over 30 Minutes Intravenous Every 8 hours 11/15/20 1644 11/16/20 0052   11/15/20 1230  ceFAZolin (ANCEF) IVPB 2g/100 mL premix        2 g 200 mL/hr over 30 Minutes Intravenous 30 min pre-op 11/15/20 1129 11/15/20 1328   11/15/20 1136  ceFAZolin (ANCEF) 2-4 GM/100ML-% IVPB       Note to Pharmacy: Laurita Quint   : cabinet override      11/15/20 1136 11/15/20 1303       Patient was given sequential compression devices and early ambulation to prevent DVT.   Patient benefited maximally from hospital stay and there were no complications. At the time of discharge, the patient was urinating/moving their bowels without difficulty, tolerating a regular diet, pain is controlled with oral pain medications and they have been cleared by PT/OT.   Recent vital signs: No data found.   Recent laboratory studies: No results for input(s): WBC, HGB, HCT, PLT, NA, K, CL, CO2, BUN, CREATININE, GLUCOSE, INR, CALCIUM in the last 72 hours.  Invalid input(s): PT, 2   Discharge Medications:   Allergies as of 11/16/2020   No Known Allergies     Medication List    STOP taking these medications   aspirin 81 MG EC tablet   Melatonin 10 MG Tabs   multivitamin capsule   Oxycodone HCl 10  MG Tabs     TAKE these medications   albuterol 108 (90 Base) MCG/ACT inhaler Commonly known as: VENTOLIN HFA Inhale 2 puffs into the lungs every 6 (six) hours as needed for wheezing or shortness of breath.   Cholecalciferol 50 MCG (2000 UT) Tabs Take 2,000 Units by mouth daily.   clopidogrel 75 MG tablet Commonly known as: PLAVIX Take 75 mg by mouth daily.   hydrochlorothiazide 25 MG tablet Commonly known as: HYDRODIURIL Take 25 mg by mouth daily.   hydrochlorothiazide 12.5 MG capsule Commonly known as: MICROZIDE Take 12.5 mg by mouth daily.   lisinopril 40 MG tablet Commonly known as: ZESTRIL Take  40 mg by mouth daily.   methocarbamol 500 MG tablet Commonly known as: Robaxin Take 1 tablet (500 mg total) by mouth every 8 (eight) hours as needed for up to 5 days for muscle spasms.   metoprolol succinate 50 MG 24 hr tablet Commonly known as: TOPROL-XL Take 50 mg by mouth daily. Take with or immediately following a meal.   naloxone 4 MG/0.1ML Liqd nasal spray kit Commonly known as: NARCAN Place 1 spray into the nose once.   nitroGLYCERIN 0.4 MG SL tablet Commonly known as: NITROSTAT Place 0.4 mg under the tongue every 5 (five) minutes as needed for chest pain.   ondansetron 4 MG tablet Commonly known as: Zofran Take 1 tablet (4 mg total) by mouth every 8 (eight) hours as needed for nausea or vomiting.   oxyCODONE-acetaminophen 10-325 MG tablet Commonly known as: Percocet Take 1 tablet by mouth every 6 (six) hours as needed for up to 5 days for pain.   ranolazine 500 MG 12 hr tablet Commonly known as: RANEXA Take 500 mg by mouth 2 (two) times daily.   rosuvastatin 20 MG tablet Commonly known as: CRESTOR Take 20 mg by mouth at bedtime.   senna 8.6 MG Tabs tablet Commonly known as: SENOKOT Take 1 tablet by mouth daily.   umeclidinium-vilanterol 62.5-25 MCG/INH Aepb Commonly known as: ANORO ELLIPTA Inhale 1 puff into the lungs daily.       Diagnostic Studies: DG Chest 2 View  Result Date: 11/14/2020 CLINICAL DATA:  Preop. EXAM: CHEST - 2 VIEW COMPARISON:  Chest radiograph 08/23/2019, chest CT 07/25/2020 FINDINGS: Chronic hyperinflation and bronchial thickening.The cardiomediastinal contours are normal. Coronary stent is visualized. Pulmonary vasculature is normal. No consolidation, pleural effusion, or pneumothorax. No acute osseous abnormalities are seen. Spinal stimulator in place. IMPRESSION: 1. No acute findings. 2. Chronic hyperinflation and bronchial thickening, consistent with known COPD. Electronically Signed   By: Keith Rake M.D.   On: 11/14/2020 12:18    DG Lumbar Spine 2-3 Views  Result Date: 11/15/2020 CLINICAL DATA:  Postop L3-4 XLIF.  Evaluate for foreign body. EXAM: LUMBAR SPINE - 2-3 VIEW; DG C-ARM 1-60 MIN COMPARISON:  CT lumbar spine 09/21/2020. FINDINGS: C-arm fluoroscopy was provided in the operating room. 1 minutes and 50 seconds of fluoroscopy time. 67.87 mGy. Three spot fluoroscopic images are submitted from the operating room. These have a relatively small field-of-view which does not include the entire lumbar spine. Based on previous imaging, these images show interval left lateral XLIF at L3-4. The hardware appears well positioned. Pre-existing postsurgical changes are partially imaged from L4 through S1. No complications are identified. No evidence of retained surgical instrument. IMPRESSION: Intraoperative views following L3-4 XLIF. No demonstrated complication. These results were called by telephone at the time of interpretation on 11/15/2020 at 2:33 pm to representative of provider [facility] BROOKS in  the operating room, who verbally acknowledged these results. Electronically Signed   By: Richardean Sale M.D.   On: 11/15/2020 14:36   DG C-Arm 1-60 Min  Result Date: 11/15/2020 CLINICAL DATA:  Postop L3-4 XLIF.  Evaluate for foreign body. EXAM: LUMBAR SPINE - 2-3 VIEW; DG C-ARM 1-60 MIN COMPARISON:  CT lumbar spine 09/21/2020. FINDINGS: C-arm fluoroscopy was provided in the operating room. 1 minutes and 50 seconds of fluoroscopy time. 67.87 mGy. Three spot fluoroscopic images are submitted from the operating room. These have a relatively small field-of-view which does not include the entire lumbar spine. Based on previous imaging, these images show interval left lateral XLIF at L3-4. The hardware appears well positioned. Pre-existing postsurgical changes are partially imaged from L4 through S1. No complications are identified. No evidence of retained surgical instrument. IMPRESSION: Intraoperative views following L3-4 XLIF. No demonstrated  complication. These results were called by telephone at the time of interpretation on 11/15/2020 at 2:33 pm to representative of provider [facility] BROOKS in the operating room, who verbally acknowledged these results. Electronically Signed   By: Richardean Sale M.D.   On: 11/15/2020 14:36    Discharge Instructions    Incentive spirometry RT   Complete by: As directed        Follow-up Information    Melina Schools, MD. Schedule an appointment as soon as possible for a visit in 2 weeks.   Specialty: Orthopedic Surgery Why: If symptoms worsen, For suture removal, For wound re-check Contact information: [address] [place] [address] [phone]               Discharge Plan:  discharge to home  Disposition: stable    Signed: Charlyne Petrin for [facility] PA-C [facility] [phone] 11/17/2020, 2:16 PM

## 2020-12-05 DIAGNOSIS — H6121 Impacted cerumen, right ear: Secondary | ICD-10-CM | POA: Diagnosis not present

## 2020-12-05 DIAGNOSIS — H938X2 Other specified disorders of left ear: Secondary | ICD-10-CM | POA: Diagnosis not present

## 2020-12-05 DIAGNOSIS — F17210 Nicotine dependence, cigarettes, uncomplicated: Secondary | ICD-10-CM | POA: Diagnosis not present

## 2020-12-11 DIAGNOSIS — I10 Essential (primary) hypertension: Secondary | ICD-10-CM | POA: Diagnosis not present

## 2020-12-11 DIAGNOSIS — G4733 Obstructive sleep apnea (adult) (pediatric): Secondary | ICD-10-CM | POA: Diagnosis not present

## 2020-12-11 DIAGNOSIS — E785 Hyperlipidemia, unspecified: Secondary | ICD-10-CM | POA: Diagnosis not present

## 2020-12-13 DIAGNOSIS — Z79899 Other long term (current) drug therapy: Secondary | ICD-10-CM | POA: Diagnosis not present

## 2020-12-13 DIAGNOSIS — M79605 Pain in left leg: Secondary | ICD-10-CM | POA: Diagnosis not present

## 2020-12-13 DIAGNOSIS — G894 Chronic pain syndrome: Secondary | ICD-10-CM | POA: Diagnosis not present

## 2020-12-13 DIAGNOSIS — M4727 Other spondylosis with radiculopathy, lumbosacral region: Secondary | ICD-10-CM | POA: Diagnosis not present

## 2020-12-13 DIAGNOSIS — Z79891 Long term (current) use of opiate analgesic: Secondary | ICD-10-CM | POA: Diagnosis not present

## 2020-12-13 DIAGNOSIS — M961 Postlaminectomy syndrome, not elsewhere classified: Secondary | ICD-10-CM | POA: Diagnosis not present

## 2020-12-26 DIAGNOSIS — Z4889 Encounter for other specified surgical aftercare: Secondary | ICD-10-CM | POA: Diagnosis not present

## 2020-12-27 DIAGNOSIS — M545 Low back pain, unspecified: Secondary | ICD-10-CM | POA: Diagnosis not present

## 2021-01-03 DIAGNOSIS — M545 Low back pain, unspecified: Secondary | ICD-10-CM | POA: Diagnosis not present

## 2021-01-05 DIAGNOSIS — M545 Low back pain, unspecified: Secondary | ICD-10-CM | POA: Diagnosis not present

## 2021-01-09 DIAGNOSIS — M545 Low back pain, unspecified: Secondary | ICD-10-CM | POA: Diagnosis not present

## 2021-01-11 DIAGNOSIS — I10 Essential (primary) hypertension: Secondary | ICD-10-CM | POA: Diagnosis not present

## 2021-01-11 DIAGNOSIS — M79605 Pain in left leg: Secondary | ICD-10-CM | POA: Diagnosis not present

## 2021-01-11 DIAGNOSIS — E785 Hyperlipidemia, unspecified: Secondary | ICD-10-CM | POA: Diagnosis not present

## 2021-01-11 DIAGNOSIS — M961 Postlaminectomy syndrome, not elsewhere classified: Secondary | ICD-10-CM | POA: Diagnosis not present

## 2021-01-11 DIAGNOSIS — G894 Chronic pain syndrome: Secondary | ICD-10-CM | POA: Diagnosis not present

## 2021-01-11 DIAGNOSIS — I2510 Atherosclerotic heart disease of native coronary artery without angina pectoris: Secondary | ICD-10-CM | POA: Diagnosis not present

## 2021-01-11 DIAGNOSIS — M4727 Other spondylosis with radiculopathy, lumbosacral region: Secondary | ICD-10-CM | POA: Diagnosis not present

## 2021-01-22 DIAGNOSIS — M545 Low back pain, unspecified: Secondary | ICD-10-CM | POA: Diagnosis not present

## 2021-01-24 DIAGNOSIS — M545 Low back pain, unspecified: Secondary | ICD-10-CM | POA: Diagnosis not present

## 2021-01-30 DIAGNOSIS — M545 Low back pain, unspecified: Secondary | ICD-10-CM | POA: Diagnosis not present

## 2021-02-07 DIAGNOSIS — H2513 Age-related nuclear cataract, bilateral: Secondary | ICD-10-CM | POA: Diagnosis not present

## 2021-02-12 DIAGNOSIS — Z4889 Encounter for other specified surgical aftercare: Secondary | ICD-10-CM | POA: Diagnosis not present

## 2021-02-13 DIAGNOSIS — M545 Low back pain, unspecified: Secondary | ICD-10-CM | POA: Diagnosis not present

## 2021-02-13 DIAGNOSIS — Z9049 Acquired absence of other specified parts of digestive tract: Secondary | ICD-10-CM | POA: Diagnosis not present

## 2021-02-13 DIAGNOSIS — R1032 Left lower quadrant pain: Secondary | ICD-10-CM | POA: Diagnosis not present

## 2021-02-13 DIAGNOSIS — N132 Hydronephrosis with renal and ureteral calculous obstruction: Secondary | ICD-10-CM | POA: Diagnosis not present

## 2021-02-13 DIAGNOSIS — N23 Unspecified renal colic: Secondary | ICD-10-CM | POA: Diagnosis not present

## 2021-02-13 DIAGNOSIS — I700 Atherosclerosis of aorta: Secondary | ICD-10-CM | POA: Diagnosis not present

## 2021-02-13 DIAGNOSIS — I714 Abdominal aortic aneurysm, without rupture: Secondary | ICD-10-CM | POA: Diagnosis not present

## 2021-02-20 DIAGNOSIS — M961 Postlaminectomy syndrome, not elsewhere classified: Secondary | ICD-10-CM | POA: Diagnosis not present

## 2021-02-20 DIAGNOSIS — M4727 Other spondylosis with radiculopathy, lumbosacral region: Secondary | ICD-10-CM | POA: Diagnosis not present

## 2021-02-20 DIAGNOSIS — G894 Chronic pain syndrome: Secondary | ICD-10-CM | POA: Diagnosis not present

## 2021-02-20 DIAGNOSIS — M79605 Pain in left leg: Secondary | ICD-10-CM | POA: Diagnosis not present

## 2021-02-22 DIAGNOSIS — N200 Calculus of kidney: Secondary | ICD-10-CM | POA: Diagnosis not present

## 2021-02-22 DIAGNOSIS — I259 Chronic ischemic heart disease, unspecified: Secondary | ICD-10-CM | POA: Diagnosis not present

## 2021-02-23 DIAGNOSIS — N201 Calculus of ureter: Secondary | ICD-10-CM | POA: Diagnosis not present

## 2021-02-23 DIAGNOSIS — I878 Other specified disorders of veins: Secondary | ICD-10-CM | POA: Diagnosis not present

## 2021-02-23 DIAGNOSIS — R110 Nausea: Secondary | ICD-10-CM | POA: Diagnosis not present

## 2021-02-23 DIAGNOSIS — Z79899 Other long term (current) drug therapy: Secondary | ICD-10-CM | POA: Diagnosis not present

## 2021-02-23 DIAGNOSIS — Z981 Arthrodesis status: Secondary | ICD-10-CM | POA: Diagnosis not present

## 2021-02-23 DIAGNOSIS — N1330 Unspecified hydronephrosis: Secondary | ICD-10-CM | POA: Diagnosis not present

## 2021-02-23 DIAGNOSIS — Z87442 Personal history of urinary calculi: Secondary | ICD-10-CM | POA: Diagnosis not present

## 2021-03-01 DIAGNOSIS — H6121 Impacted cerumen, right ear: Secondary | ICD-10-CM | POA: Diagnosis not present

## 2021-03-08 DIAGNOSIS — N201 Calculus of ureter: Secondary | ICD-10-CM | POA: Diagnosis not present

## 2021-03-08 DIAGNOSIS — N1330 Unspecified hydronephrosis: Secondary | ICD-10-CM | POA: Diagnosis not present

## 2021-03-08 DIAGNOSIS — N200 Calculus of kidney: Secondary | ICD-10-CM | POA: Diagnosis not present

## 2021-03-12 DIAGNOSIS — G4733 Obstructive sleep apnea (adult) (pediatric): Secondary | ICD-10-CM | POA: Diagnosis not present

## 2021-03-14 DIAGNOSIS — E785 Hyperlipidemia, unspecified: Secondary | ICD-10-CM | POA: Diagnosis not present

## 2021-03-14 DIAGNOSIS — I2510 Atherosclerotic heart disease of native coronary artery without angina pectoris: Secondary | ICD-10-CM | POA: Diagnosis not present

## 2021-03-14 DIAGNOSIS — I10 Essential (primary) hypertension: Secondary | ICD-10-CM | POA: Diagnosis not present

## 2021-03-20 DIAGNOSIS — H35372 Puckering of macula, left eye: Secondary | ICD-10-CM | POA: Diagnosis not present

## 2021-03-20 DIAGNOSIS — H40033 Anatomical narrow angle, bilateral: Secondary | ICD-10-CM | POA: Diagnosis not present

## 2021-03-20 DIAGNOSIS — H25811 Combined forms of age-related cataract, right eye: Secondary | ICD-10-CM | POA: Diagnosis not present

## 2021-03-20 DIAGNOSIS — Z01818 Encounter for other preprocedural examination: Secondary | ICD-10-CM | POA: Diagnosis not present

## 2021-03-21 DIAGNOSIS — M79605 Pain in left leg: Secondary | ICD-10-CM | POA: Diagnosis not present

## 2021-03-21 DIAGNOSIS — G894 Chronic pain syndrome: Secondary | ICD-10-CM | POA: Diagnosis not present

## 2021-03-21 DIAGNOSIS — M4727 Other spondylosis with radiculopathy, lumbosacral region: Secondary | ICD-10-CM | POA: Diagnosis not present

## 2021-03-21 DIAGNOSIS — M961 Postlaminectomy syndrome, not elsewhere classified: Secondary | ICD-10-CM | POA: Diagnosis not present

## 2021-03-27 DIAGNOSIS — H25811 Combined forms of age-related cataract, right eye: Secondary | ICD-10-CM | POA: Diagnosis not present

## 2021-03-27 DIAGNOSIS — F17210 Nicotine dependence, cigarettes, uncomplicated: Secondary | ICD-10-CM | POA: Diagnosis not present

## 2021-03-27 DIAGNOSIS — I10 Essential (primary) hypertension: Secondary | ICD-10-CM | POA: Diagnosis not present

## 2021-03-27 DIAGNOSIS — H259 Unspecified age-related cataract: Secondary | ICD-10-CM | POA: Diagnosis not present

## 2021-03-27 DIAGNOSIS — J449 Chronic obstructive pulmonary disease, unspecified: Secondary | ICD-10-CM | POA: Diagnosis not present

## 2021-04-19 DIAGNOSIS — M4727 Other spondylosis with radiculopathy, lumbosacral region: Secondary | ICD-10-CM | POA: Diagnosis not present

## 2021-04-19 DIAGNOSIS — G894 Chronic pain syndrome: Secondary | ICD-10-CM | POA: Diagnosis not present

## 2021-04-19 DIAGNOSIS — M79605 Pain in left leg: Secondary | ICD-10-CM | POA: Diagnosis not present

## 2021-04-19 DIAGNOSIS — M961 Postlaminectomy syndrome, not elsewhere classified: Secondary | ICD-10-CM | POA: Diagnosis not present

## 2021-04-25 DIAGNOSIS — Z23 Encounter for immunization: Secondary | ICD-10-CM | POA: Diagnosis not present

## 2021-05-01 DIAGNOSIS — H26492 Other secondary cataract, left eye: Secondary | ICD-10-CM | POA: Diagnosis not present

## 2021-05-07 DIAGNOSIS — Z981 Arthrodesis status: Secondary | ICD-10-CM | POA: Diagnosis not present

## 2021-05-07 DIAGNOSIS — R109 Unspecified abdominal pain: Secondary | ICD-10-CM | POA: Diagnosis not present

## 2021-05-07 DIAGNOSIS — I878 Other specified disorders of veins: Secondary | ICD-10-CM | POA: Diagnosis not present

## 2021-05-07 DIAGNOSIS — R1084 Generalized abdominal pain: Secondary | ICD-10-CM | POA: Diagnosis not present

## 2021-05-08 DIAGNOSIS — N1330 Unspecified hydronephrosis: Secondary | ICD-10-CM | POA: Diagnosis not present

## 2021-05-08 DIAGNOSIS — N200 Calculus of kidney: Secondary | ICD-10-CM | POA: Diagnosis not present

## 2021-05-08 DIAGNOSIS — N201 Calculus of ureter: Secondary | ICD-10-CM | POA: Diagnosis not present

## 2021-05-14 DIAGNOSIS — J449 Chronic obstructive pulmonary disease, unspecified: Secondary | ICD-10-CM | POA: Diagnosis not present

## 2021-05-14 DIAGNOSIS — E782 Mixed hyperlipidemia: Secondary | ICD-10-CM | POA: Diagnosis not present

## 2021-05-14 DIAGNOSIS — I10 Essential (primary) hypertension: Secondary | ICD-10-CM | POA: Diagnosis not present

## 2021-05-14 DIAGNOSIS — Z4889 Encounter for other specified surgical aftercare: Secondary | ICD-10-CM | POA: Diagnosis not present

## 2021-05-17 DIAGNOSIS — Z79891 Long term (current) use of opiate analgesic: Secondary | ICD-10-CM | POA: Diagnosis not present

## 2021-05-17 DIAGNOSIS — G894 Chronic pain syndrome: Secondary | ICD-10-CM | POA: Diagnosis not present

## 2021-05-17 DIAGNOSIS — M25559 Pain in unspecified hip: Secondary | ICD-10-CM | POA: Diagnosis not present

## 2021-05-17 DIAGNOSIS — M5417 Radiculopathy, lumbosacral region: Secondary | ICD-10-CM | POA: Diagnosis not present

## 2021-05-17 DIAGNOSIS — Z79899 Other long term (current) drug therapy: Secondary | ICD-10-CM | POA: Diagnosis not present

## 2021-05-17 DIAGNOSIS — M79606 Pain in leg, unspecified: Secondary | ICD-10-CM | POA: Diagnosis not present

## 2021-06-05 DIAGNOSIS — H6123 Impacted cerumen, bilateral: Secondary | ICD-10-CM | POA: Diagnosis not present

## 2021-06-11 DIAGNOSIS — G4733 Obstructive sleep apnea (adult) (pediatric): Secondary | ICD-10-CM | POA: Diagnosis not present

## 2021-06-13 DIAGNOSIS — J449 Chronic obstructive pulmonary disease, unspecified: Secondary | ICD-10-CM | POA: Diagnosis not present

## 2021-06-13 DIAGNOSIS — E782 Mixed hyperlipidemia: Secondary | ICD-10-CM | POA: Diagnosis not present

## 2021-06-14 DIAGNOSIS — M25559 Pain in unspecified hip: Secondary | ICD-10-CM | POA: Diagnosis not present

## 2021-06-14 DIAGNOSIS — M25511 Pain in right shoulder: Secondary | ICD-10-CM | POA: Diagnosis not present

## 2021-06-14 DIAGNOSIS — M5417 Radiculopathy, lumbosacral region: Secondary | ICD-10-CM | POA: Diagnosis not present

## 2021-06-14 DIAGNOSIS — G894 Chronic pain syndrome: Secondary | ICD-10-CM | POA: Diagnosis not present

## 2021-06-22 DIAGNOSIS — I6529 Occlusion and stenosis of unspecified carotid artery: Secondary | ICD-10-CM | POA: Diagnosis not present

## 2021-06-22 DIAGNOSIS — I10 Essential (primary) hypertension: Secondary | ICD-10-CM | POA: Diagnosis not present

## 2021-06-22 DIAGNOSIS — Z720 Tobacco use: Secondary | ICD-10-CM | POA: Diagnosis not present

## 2021-06-22 DIAGNOSIS — I714 Abdominal aortic aneurysm, without rupture, unspecified: Secondary | ICD-10-CM | POA: Diagnosis not present

## 2021-06-22 DIAGNOSIS — I712 Thoracic aortic aneurysm, without rupture, unspecified: Secondary | ICD-10-CM | POA: Diagnosis not present

## 2021-06-22 DIAGNOSIS — I2510 Atherosclerotic heart disease of native coronary artery without angina pectoris: Secondary | ICD-10-CM | POA: Diagnosis not present

## 2021-06-22 DIAGNOSIS — E785 Hyperlipidemia, unspecified: Secondary | ICD-10-CM | POA: Diagnosis not present

## 2021-07-10 DIAGNOSIS — J8410 Pulmonary fibrosis, unspecified: Secondary | ICD-10-CM | POA: Diagnosis not present

## 2021-07-10 DIAGNOSIS — Z9049 Acquired absence of other specified parts of digestive tract: Secondary | ICD-10-CM | POA: Diagnosis not present

## 2021-07-10 DIAGNOSIS — J439 Emphysema, unspecified: Secondary | ICD-10-CM | POA: Diagnosis not present

## 2021-07-10 DIAGNOSIS — I7121 Aneurysm of the ascending aorta, without rupture: Secondary | ICD-10-CM | POA: Diagnosis not present

## 2021-07-10 DIAGNOSIS — I700 Atherosclerosis of aorta: Secondary | ICD-10-CM | POA: Diagnosis not present

## 2021-07-19 DIAGNOSIS — M25559 Pain in unspecified hip: Secondary | ICD-10-CM | POA: Diagnosis not present

## 2021-07-19 DIAGNOSIS — M25511 Pain in right shoulder: Secondary | ICD-10-CM | POA: Diagnosis not present

## 2021-07-19 DIAGNOSIS — M5417 Radiculopathy, lumbosacral region: Secondary | ICD-10-CM | POA: Diagnosis not present

## 2021-07-19 DIAGNOSIS — G894 Chronic pain syndrome: Secondary | ICD-10-CM | POA: Diagnosis not present

## 2021-09-06 DIAGNOSIS — G894 Chronic pain syndrome: Secondary | ICD-10-CM | POA: Diagnosis not present

## 2021-09-06 DIAGNOSIS — Z79899 Other long term (current) drug therapy: Secondary | ICD-10-CM | POA: Diagnosis not present

## 2021-09-06 DIAGNOSIS — M25511 Pain in right shoulder: Secondary | ICD-10-CM | POA: Diagnosis not present

## 2021-09-06 DIAGNOSIS — M5417 Radiculopathy, lumbosacral region: Secondary | ICD-10-CM | POA: Diagnosis not present

## 2021-09-06 DIAGNOSIS — Z79891 Long term (current) use of opiate analgesic: Secondary | ICD-10-CM | POA: Diagnosis not present

## 2021-09-06 DIAGNOSIS — M25559 Pain in unspecified hip: Secondary | ICD-10-CM | POA: Diagnosis not present

## 2021-09-11 DIAGNOSIS — J449 Chronic obstructive pulmonary disease, unspecified: Secondary | ICD-10-CM | POA: Diagnosis not present

## 2021-09-11 DIAGNOSIS — E782 Mixed hyperlipidemia: Secondary | ICD-10-CM | POA: Diagnosis not present

## 2021-09-11 DIAGNOSIS — I10 Essential (primary) hypertension: Secondary | ICD-10-CM | POA: Diagnosis not present

## 2021-09-12 DIAGNOSIS — G4733 Obstructive sleep apnea (adult) (pediatric): Secondary | ICD-10-CM | POA: Diagnosis not present

## 2021-09-27 DIAGNOSIS — H6121 Impacted cerumen, right ear: Secondary | ICD-10-CM | POA: Diagnosis not present

## 2021-10-04 DIAGNOSIS — M5116 Intervertebral disc disorders with radiculopathy, lumbar region: Secondary | ICD-10-CM | POA: Diagnosis not present

## 2021-10-10 DIAGNOSIS — F17210 Nicotine dependence, cigarettes, uncomplicated: Secondary | ICD-10-CM | POA: Diagnosis not present

## 2021-10-10 DIAGNOSIS — M5416 Radiculopathy, lumbar region: Secondary | ICD-10-CM | POA: Diagnosis not present

## 2021-10-10 DIAGNOSIS — M961 Postlaminectomy syndrome, not elsewhere classified: Secondary | ICD-10-CM | POA: Diagnosis not present

## 2021-10-10 DIAGNOSIS — Z79899 Other long term (current) drug therapy: Secondary | ICD-10-CM | POA: Diagnosis not present

## 2021-10-10 DIAGNOSIS — G8929 Other chronic pain: Secondary | ICD-10-CM | POA: Diagnosis not present

## 2021-11-06 DIAGNOSIS — Z9049 Acquired absence of other specified parts of digestive tract: Secondary | ICD-10-CM | POA: Diagnosis not present

## 2021-11-06 DIAGNOSIS — Z87442 Personal history of urinary calculi: Secondary | ICD-10-CM | POA: Diagnosis not present

## 2021-11-06 DIAGNOSIS — Z981 Arthrodesis status: Secondary | ICD-10-CM | POA: Diagnosis not present

## 2021-11-06 DIAGNOSIS — N200 Calculus of kidney: Secondary | ICD-10-CM | POA: Diagnosis not present

## 2021-11-07 DIAGNOSIS — N201 Calculus of ureter: Secondary | ICD-10-CM | POA: Diagnosis not present

## 2021-11-07 DIAGNOSIS — N1330 Unspecified hydronephrosis: Secondary | ICD-10-CM | POA: Diagnosis not present

## 2021-11-07 DIAGNOSIS — N200 Calculus of kidney: Secondary | ICD-10-CM | POA: Diagnosis not present

## 2021-11-09 DIAGNOSIS — G8929 Other chronic pain: Secondary | ICD-10-CM | POA: Diagnosis not present

## 2021-11-09 DIAGNOSIS — Z79899 Other long term (current) drug therapy: Secondary | ICD-10-CM | POA: Diagnosis not present

## 2021-11-09 DIAGNOSIS — M5416 Radiculopathy, lumbar region: Secondary | ICD-10-CM | POA: Diagnosis not present

## 2021-11-09 DIAGNOSIS — M961 Postlaminectomy syndrome, not elsewhere classified: Secondary | ICD-10-CM | POA: Diagnosis not present

## 2021-11-11 DIAGNOSIS — E782 Mixed hyperlipidemia: Secondary | ICD-10-CM | POA: Diagnosis not present

## 2021-11-11 DIAGNOSIS — I10 Essential (primary) hypertension: Secondary | ICD-10-CM | POA: Diagnosis not present

## 2021-11-11 DIAGNOSIS — J449 Chronic obstructive pulmonary disease, unspecified: Secondary | ICD-10-CM | POA: Diagnosis not present

## 2021-11-27 DIAGNOSIS — Z0000 Encounter for general adult medical examination without abnormal findings: Secondary | ICD-10-CM | POA: Diagnosis not present

## 2021-11-27 DIAGNOSIS — I714 Abdominal aortic aneurysm, without rupture, unspecified: Secondary | ICD-10-CM | POA: Diagnosis not present

## 2021-11-27 DIAGNOSIS — E782 Mixed hyperlipidemia: Secondary | ICD-10-CM | POA: Diagnosis not present

## 2021-11-27 DIAGNOSIS — D510 Vitamin B12 deficiency anemia due to intrinsic factor deficiency: Secondary | ICD-10-CM | POA: Diagnosis not present

## 2021-11-27 DIAGNOSIS — Z720 Tobacco use: Secondary | ICD-10-CM | POA: Diagnosis not present

## 2021-11-27 DIAGNOSIS — I259 Chronic ischemic heart disease, unspecified: Secondary | ICD-10-CM | POA: Diagnosis not present

## 2021-11-27 DIAGNOSIS — J449 Chronic obstructive pulmonary disease, unspecified: Secondary | ICD-10-CM | POA: Diagnosis not present

## 2021-11-28 DIAGNOSIS — M25552 Pain in left hip: Secondary | ICD-10-CM | POA: Diagnosis not present

## 2021-11-28 DIAGNOSIS — M25551 Pain in right hip: Secondary | ICD-10-CM | POA: Diagnosis not present

## 2021-12-05 DIAGNOSIS — G4733 Obstructive sleep apnea (adult) (pediatric): Secondary | ICD-10-CM | POA: Diagnosis not present

## 2021-12-06 DIAGNOSIS — Z79899 Other long term (current) drug therapy: Secondary | ICD-10-CM | POA: Diagnosis not present

## 2021-12-06 DIAGNOSIS — M961 Postlaminectomy syndrome, not elsewhere classified: Secondary | ICD-10-CM | POA: Diagnosis not present

## 2021-12-06 DIAGNOSIS — M5416 Radiculopathy, lumbar region: Secondary | ICD-10-CM | POA: Diagnosis not present

## 2021-12-06 DIAGNOSIS — G8929 Other chronic pain: Secondary | ICD-10-CM | POA: Diagnosis not present

## 2021-12-10 DIAGNOSIS — Z79899 Other long term (current) drug therapy: Secondary | ICD-10-CM | POA: Diagnosis not present

## 2021-12-12 DIAGNOSIS — I10 Essential (primary) hypertension: Secondary | ICD-10-CM | POA: Diagnosis not present

## 2021-12-12 DIAGNOSIS — J449 Chronic obstructive pulmonary disease, unspecified: Secondary | ICD-10-CM | POA: Diagnosis not present

## 2021-12-12 DIAGNOSIS — E782 Mixed hyperlipidemia: Secondary | ICD-10-CM | POA: Diagnosis not present

## 2022-01-03 DIAGNOSIS — G8929 Other chronic pain: Secondary | ICD-10-CM | POA: Diagnosis not present

## 2022-01-03 DIAGNOSIS — Z79899 Other long term (current) drug therapy: Secondary | ICD-10-CM | POA: Diagnosis not present

## 2022-01-03 DIAGNOSIS — M5416 Radiculopathy, lumbar region: Secondary | ICD-10-CM | POA: Diagnosis not present

## 2022-01-03 DIAGNOSIS — M961 Postlaminectomy syndrome, not elsewhere classified: Secondary | ICD-10-CM | POA: Diagnosis not present

## 2022-01-09 DIAGNOSIS — Z79899 Other long term (current) drug therapy: Secondary | ICD-10-CM | POA: Diagnosis not present

## 2022-01-11 DIAGNOSIS — J449 Chronic obstructive pulmonary disease, unspecified: Secondary | ICD-10-CM | POA: Diagnosis not present

## 2022-01-11 DIAGNOSIS — I10 Essential (primary) hypertension: Secondary | ICD-10-CM | POA: Diagnosis not present

## 2022-01-11 DIAGNOSIS — E782 Mixed hyperlipidemia: Secondary | ICD-10-CM | POA: Diagnosis not present

## 2022-01-17 DIAGNOSIS — H6121 Impacted cerumen, right ear: Secondary | ICD-10-CM | POA: Diagnosis not present

## 2022-01-31 DIAGNOSIS — G8929 Other chronic pain: Secondary | ICD-10-CM | POA: Diagnosis not present

## 2022-01-31 DIAGNOSIS — Z79899 Other long term (current) drug therapy: Secondary | ICD-10-CM | POA: Diagnosis not present

## 2022-01-31 DIAGNOSIS — M961 Postlaminectomy syndrome, not elsewhere classified: Secondary | ICD-10-CM | POA: Diagnosis not present

## 2022-01-31 DIAGNOSIS — S0031XA Abrasion of nose, initial encounter: Secondary | ICD-10-CM | POA: Diagnosis not present

## 2022-01-31 DIAGNOSIS — Z6824 Body mass index (BMI) 24.0-24.9, adult: Secondary | ICD-10-CM | POA: Diagnosis not present

## 2022-01-31 DIAGNOSIS — S0033XA Contusion of nose, initial encounter: Secondary | ICD-10-CM | POA: Diagnosis not present

## 2022-01-31 DIAGNOSIS — W19XXXA Unspecified fall, initial encounter: Secondary | ICD-10-CM | POA: Diagnosis not present

## 2022-01-31 DIAGNOSIS — S0121XA Laceration without foreign body of nose, initial encounter: Secondary | ICD-10-CM | POA: Diagnosis not present

## 2022-01-31 DIAGNOSIS — M5416 Radiculopathy, lumbar region: Secondary | ICD-10-CM | POA: Diagnosis not present

## 2022-02-05 DIAGNOSIS — J342 Deviated nasal septum: Secondary | ICD-10-CM | POA: Diagnosis not present

## 2022-02-05 DIAGNOSIS — S022XXA Fracture of nasal bones, initial encounter for closed fracture: Secondary | ICD-10-CM | POA: Diagnosis not present

## 2022-02-05 DIAGNOSIS — R0981 Nasal congestion: Secondary | ICD-10-CM | POA: Diagnosis not present

## 2022-02-05 DIAGNOSIS — W19XXXA Unspecified fall, initial encounter: Secondary | ICD-10-CM | POA: Diagnosis not present

## 2022-02-11 DIAGNOSIS — E782 Mixed hyperlipidemia: Secondary | ICD-10-CM | POA: Diagnosis not present

## 2022-02-11 DIAGNOSIS — I10 Essential (primary) hypertension: Secondary | ICD-10-CM | POA: Diagnosis not present

## 2022-02-11 DIAGNOSIS — J449 Chronic obstructive pulmonary disease, unspecified: Secondary | ICD-10-CM | POA: Diagnosis not present

## 2022-02-28 DIAGNOSIS — Z6824 Body mass index (BMI) 24.0-24.9, adult: Secondary | ICD-10-CM | POA: Diagnosis not present

## 2022-02-28 DIAGNOSIS — M961 Postlaminectomy syndrome, not elsewhere classified: Secondary | ICD-10-CM | POA: Diagnosis not present

## 2022-02-28 DIAGNOSIS — M5416 Radiculopathy, lumbar region: Secondary | ICD-10-CM | POA: Diagnosis not present

## 2022-02-28 DIAGNOSIS — G8929 Other chronic pain: Secondary | ICD-10-CM | POA: Diagnosis not present

## 2022-02-28 DIAGNOSIS — Z79899 Other long term (current) drug therapy: Secondary | ICD-10-CM | POA: Diagnosis not present

## 2022-02-28 DIAGNOSIS — Z79891 Long term (current) use of opiate analgesic: Secondary | ICD-10-CM | POA: Diagnosis not present

## 2022-03-06 DIAGNOSIS — G4733 Obstructive sleep apnea (adult) (pediatric): Secondary | ICD-10-CM | POA: Diagnosis not present

## 2022-03-14 DIAGNOSIS — J449 Chronic obstructive pulmonary disease, unspecified: Secondary | ICD-10-CM | POA: Diagnosis not present

## 2022-03-14 DIAGNOSIS — E782 Mixed hyperlipidemia: Secondary | ICD-10-CM | POA: Diagnosis not present

## 2022-03-14 DIAGNOSIS — I10 Essential (primary) hypertension: Secondary | ICD-10-CM | POA: Diagnosis not present

## 2022-03-21 IMAGING — CT CT L SPINE W/ CM
1 of 7 series · 6 of 14 positions shown, 8 images · non-contrast
Comparison: Noncontrast lumbar spine CT 03/20/2017 and lumbar
myelogram 03/17/2013

CLINICAL DATA: Pain in the low back and feet. Burning in the left
anterolateral thigh. Prior lumbar surgeries.
TECHNIQUE: Contiguous axial images were obtained through the Lumbar spine after
the intrathecal infusion of contrast. Coronal and sagittal
reconstructions were obtained of the axial image sets.

[Series 3: l spine soft (person_name) · axial · 0.34mm/px · z∈[-289,-115]mm · 6 of 123 slices shown, 8 images]
[im 18/123  soft-tissue]
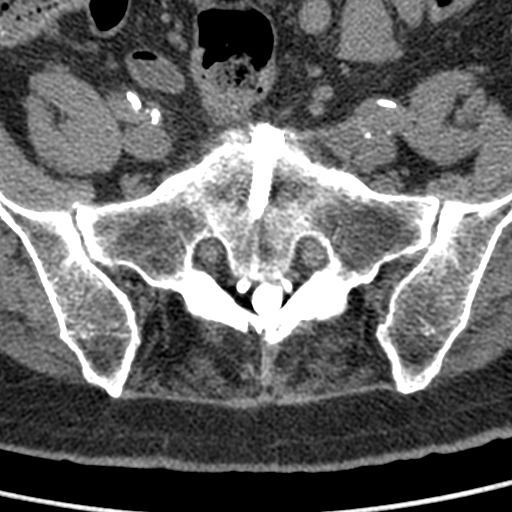
[im 18/123  bone]
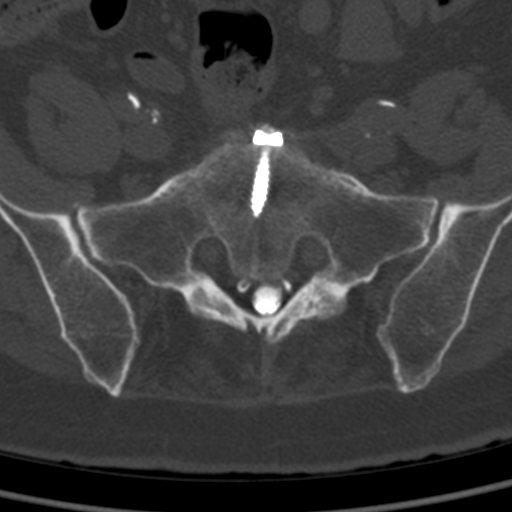
[im 35/123  bone]
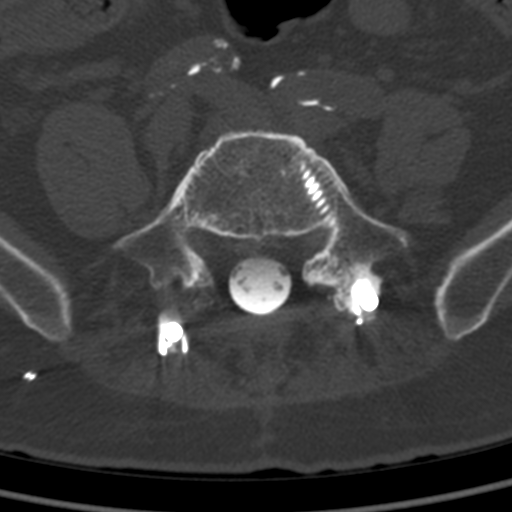
[im 53/123  bone]
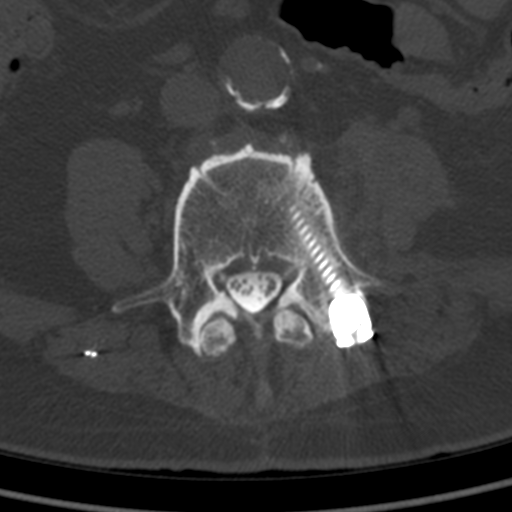
[im 70/123  bone]
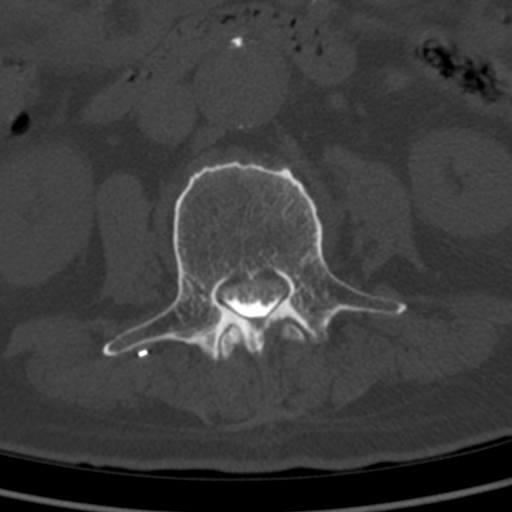
[im 88/123  soft-tissue]
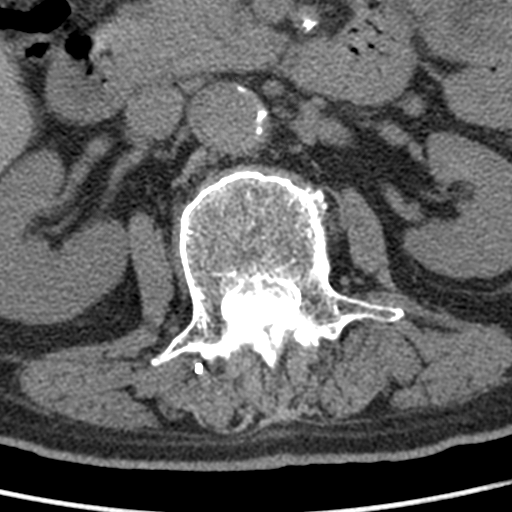
[im 88/123  bone]
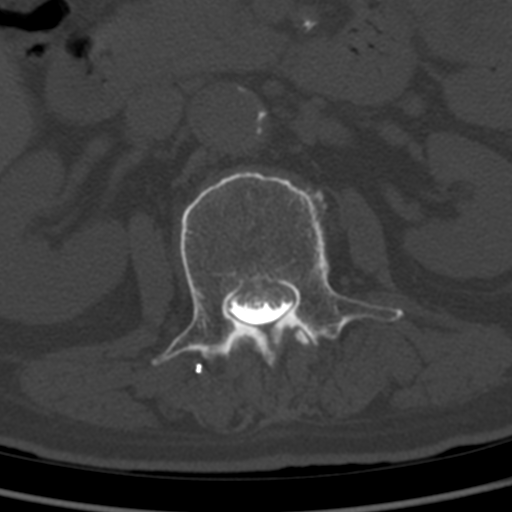
[im 105/123  bone]
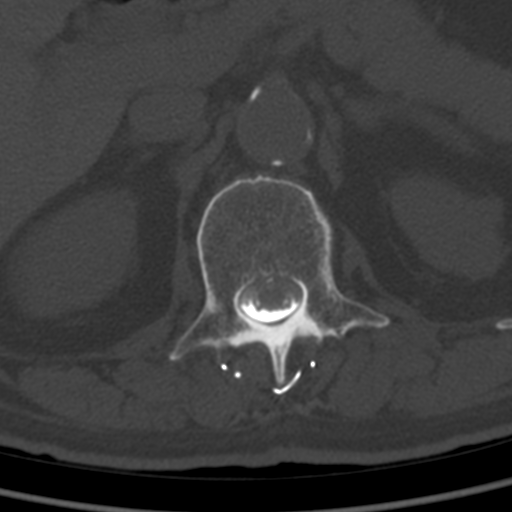

[6 of 14 positions shown; findings below may reference images not displayed]

EXAM:
LUMBAR MYELOGRAM

FLUOROSCOPY TIME:  Fluoroscopy Time: 36 seconds

Radiation Exposure Index: 315.03 microGray*m^2

PROCEDURE:
After thorough discussion of risks and benefits of the procedure
including bleeding, infection, injury to nerves, blood vessels,
adjacent structures as well as headache and CSF leak, written and
oral informed consent was obtained. Consent was obtained by Dr.
Stolarius Tabula. Time out form was completed.

Patient was positioned prone on the fluoroscopy table. Local
anesthesia was provided with 1% lidocaine without epinephrine after
prepped and draped in the usual sterile fashion. Puncture was
performed at L2-3 using a 3 inch 22 gauge spinal needle via a right
interlaminar approach. Using a single pass through the dura, the
needle was placed within the thecal sac, with return of clear CSF.
15 mL of Isovue [id number] was injected into the thecal sac, with normal
opacification of the nerve roots and cauda equina consistent with
free flow within the subarachnoid space.

I personally performed the lumbar puncture and administered the
intrathecal contrast. I also personally supervised acquisition of
the myelogram images.
FINDINGS: LUMBAR MYELOGRAM FINDINGS:

There are 5 non rib-bearing lumbar type vertebrae. There is
straightening of the normal lumbar lordosis, and there is grade 1
retrolisthesis of L4 on L5 without significant change during flexion
or extension. The thecal sac appears widely patent at the fused L4-5
and L5-S1 levels. A ventral extradural defect at L3-4 contributes to
mild spinal stenosis and left lateral recess stenosis. There are
smaller ventral extradural defects at L1-2 and L2-3 without evidence
of significant stenosis. A spinal cord stimulator is noted with
electrodes partially visualized in the thoracic spine.

CT LUMBAR MYELOGRAM FINDINGS:

There is mild lumbar dextroscoliosis. Grade 1 retrolisthesis of L4
on L5 measuring 5 mm is unchanged from 8202, as is slight
retrolisthesis of L5 on S1. No acute fracture or suspicious osseous
lesion is identified.

Sequelae of L4-5 XLIF and L5-S1 ALIF are again identified with solid
interbody arthrodesis at both levels. Posterior fusion is also again
noted with pedicle screws on the left at L4, bilaterally at L5, and
on the right at S1. There is no evidence of screw loosening. There
is solid left-sided posterior element fusion. Spinal cord stimulator
leads are partially visualized entering the canal at T11-12.

The conus medullaris terminates at L1-2. Cholecystectomy clips and
small bilateral nonobstructing renal calculi are noted. There is
abdominal aortic atherosclerosis with aneurysmal dilatation of the
infrarenal abdominal aorta to a maximal diameter of 3.2 cm, mildly
increased from 8202.

T12-L1: Moderate right and mild left facet hypertrophy without disc
herniation or stenosis.

L1-2: Disc bulging and moderate right and mild left facet
hypertrophy without significant stenosis, unchanged from 8202.

L2-3: Disc bulging and mild facet hypertrophy without significant
stenosis, unchanged.

L3-4: There is progressive disc degeneration with asymmetrically
advanced left-sided disc space narrowing, degenerative endplate
sclerosis, spurring, and vacuum disc. Circumferential disc bulging,
endplate spurring, disc space narrowing, and moderate facet
hypertrophy result in mild spinal stenosis, mild left greater than
right lateral recess stenosis, and mild right and moderate left
neural foraminal stenosis. The small focus of gas in the left
lateral recess on the 8202 CT is no longer present.

L4-5: Posterior decompression and fusion. Retrolisthesis, endplate
spurring, and facet hypertrophy result in mild-to-moderate right
greater than left neural foraminal stenosis, unchanged from 8202.
Patent spinal canal.

L5-S1: Posterior decompression and fusion. Endplate spurring and
left facet hypertrophy result in mild-to-moderate right and mild
left neural foraminal stenosis, unchanged from 8202. Patent spinal
canal.
IMPRESSION: 1. Solid L4-5 and L5-S1 fusion with mild-to-moderate residual neural
foraminal stenosis. No residual spinal stenosis.
2. Progressive adjacent segment disease at L3-4 with mild spinal
stenosis and mild right and moderate left neural foraminal stenosis.
3. 3.2 cm infrarenal abdominal aortic aneurysm, mildly increased
from 8202. Recommend follow-up ultrasound every 3 years. This
recommendation follows ACR consensus guidelines: White Paper of the
ACR Incidental Findings Committee II on Vascular Findings. [HOSPITAL] 6003; [DATE].
4. Bilateral nonobstructing nephrolithiasis.
5. Aortic Atherosclerosis ([id number].I).

## 2022-03-27 DIAGNOSIS — G8929 Other chronic pain: Secondary | ICD-10-CM | POA: Diagnosis not present

## 2022-03-27 DIAGNOSIS — Z6824 Body mass index (BMI) 24.0-24.9, adult: Secondary | ICD-10-CM | POA: Diagnosis not present

## 2022-03-27 DIAGNOSIS — M961 Postlaminectomy syndrome, not elsewhere classified: Secondary | ICD-10-CM | POA: Diagnosis not present

## 2022-03-27 DIAGNOSIS — Z79899 Other long term (current) drug therapy: Secondary | ICD-10-CM | POA: Diagnosis not present

## 2022-03-27 DIAGNOSIS — Z79891 Long term (current) use of opiate analgesic: Secondary | ICD-10-CM | POA: Diagnosis not present

## 2022-03-27 DIAGNOSIS — M5416 Radiculopathy, lumbar region: Secondary | ICD-10-CM | POA: Diagnosis not present

## 2022-03-29 DIAGNOSIS — Z720 Tobacco use: Secondary | ICD-10-CM | POA: Diagnosis not present

## 2022-03-29 DIAGNOSIS — E782 Mixed hyperlipidemia: Secondary | ICD-10-CM | POA: Diagnosis not present

## 2022-03-29 DIAGNOSIS — I712 Thoracic aortic aneurysm, without rupture, unspecified: Secondary | ICD-10-CM | POA: Diagnosis not present

## 2022-03-29 DIAGNOSIS — I714 Abdominal aortic aneurysm, without rupture, unspecified: Secondary | ICD-10-CM | POA: Diagnosis not present

## 2022-03-29 DIAGNOSIS — I6529 Occlusion and stenosis of unspecified carotid artery: Secondary | ICD-10-CM | POA: Diagnosis not present

## 2022-03-29 DIAGNOSIS — I2510 Atherosclerotic heart disease of native coronary artery without angina pectoris: Secondary | ICD-10-CM | POA: Diagnosis not present

## 2022-03-29 DIAGNOSIS — I10 Essential (primary) hypertension: Secondary | ICD-10-CM | POA: Diagnosis not present

## 2022-04-01 DIAGNOSIS — Z79899 Other long term (current) drug therapy: Secondary | ICD-10-CM | POA: Diagnosis not present

## 2022-04-24 DIAGNOSIS — M961 Postlaminectomy syndrome, not elsewhere classified: Secondary | ICD-10-CM | POA: Diagnosis not present

## 2022-04-24 DIAGNOSIS — G8929 Other chronic pain: Secondary | ICD-10-CM | POA: Diagnosis not present

## 2022-04-24 DIAGNOSIS — Z79891 Long term (current) use of opiate analgesic: Secondary | ICD-10-CM | POA: Diagnosis not present

## 2022-04-24 DIAGNOSIS — M5416 Radiculopathy, lumbar region: Secondary | ICD-10-CM | POA: Diagnosis not present

## 2022-04-24 DIAGNOSIS — Z6824 Body mass index (BMI) 24.0-24.9, adult: Secondary | ICD-10-CM | POA: Diagnosis not present

## 2022-05-10 DIAGNOSIS — N2889 Other specified disorders of kidney and ureter: Secondary | ICD-10-CM | POA: Diagnosis not present

## 2022-05-10 DIAGNOSIS — N200 Calculus of kidney: Secondary | ICD-10-CM | POA: Diagnosis not present

## 2022-05-10 DIAGNOSIS — Z87442 Personal history of urinary calculi: Secondary | ICD-10-CM | POA: Diagnosis not present

## 2022-05-10 DIAGNOSIS — I878 Other specified disorders of veins: Secondary | ICD-10-CM | POA: Diagnosis not present

## 2022-05-13 DIAGNOSIS — R110 Nausea: Secondary | ICD-10-CM | POA: Diagnosis not present

## 2022-05-13 DIAGNOSIS — N200 Calculus of kidney: Secondary | ICD-10-CM | POA: Diagnosis not present

## 2022-05-14 DIAGNOSIS — E782 Mixed hyperlipidemia: Secondary | ICD-10-CM | POA: Diagnosis not present

## 2022-05-14 DIAGNOSIS — I10 Essential (primary) hypertension: Secondary | ICD-10-CM | POA: Diagnosis not present

## 2022-05-14 DIAGNOSIS — J449 Chronic obstructive pulmonary disease, unspecified: Secondary | ICD-10-CM | POA: Diagnosis not present

## 2022-05-22 DIAGNOSIS — G8929 Other chronic pain: Secondary | ICD-10-CM | POA: Diagnosis not present

## 2022-05-22 DIAGNOSIS — M5416 Radiculopathy, lumbar region: Secondary | ICD-10-CM | POA: Diagnosis not present

## 2022-05-22 DIAGNOSIS — Z6824 Body mass index (BMI) 24.0-24.9, adult: Secondary | ICD-10-CM | POA: Diagnosis not present

## 2022-05-22 DIAGNOSIS — Z79891 Long term (current) use of opiate analgesic: Secondary | ICD-10-CM | POA: Diagnosis not present

## 2022-05-31 DIAGNOSIS — G4733 Obstructive sleep apnea (adult) (pediatric): Secondary | ICD-10-CM | POA: Diagnosis not present

## 2022-06-03 DIAGNOSIS — N200 Calculus of kidney: Secondary | ICD-10-CM | POA: Diagnosis not present

## 2022-06-03 DIAGNOSIS — Z9049 Acquired absence of other specified parts of digestive tract: Secondary | ICD-10-CM | POA: Diagnosis not present

## 2022-06-19 DIAGNOSIS — G8929 Other chronic pain: Secondary | ICD-10-CM | POA: Diagnosis not present

## 2022-06-19 DIAGNOSIS — Z79899 Other long term (current) drug therapy: Secondary | ICD-10-CM | POA: Diagnosis not present

## 2022-06-19 DIAGNOSIS — M5416 Radiculopathy, lumbar region: Secondary | ICD-10-CM | POA: Diagnosis not present

## 2022-06-19 DIAGNOSIS — Z79891 Long term (current) use of opiate analgesic: Secondary | ICD-10-CM | POA: Diagnosis not present

## 2022-07-10 DIAGNOSIS — H26491 Other secondary cataract, right eye: Secondary | ICD-10-CM | POA: Diagnosis not present

## 2022-07-17 DIAGNOSIS — H93293 Other abnormal auditory perceptions, bilateral: Secondary | ICD-10-CM | POA: Diagnosis not present

## 2022-07-17 DIAGNOSIS — H6123 Impacted cerumen, bilateral: Secondary | ICD-10-CM | POA: Diagnosis not present

## 2022-07-24 DIAGNOSIS — Z79899 Other long term (current) drug therapy: Secondary | ICD-10-CM | POA: Diagnosis not present

## 2022-07-24 DIAGNOSIS — Z79891 Long term (current) use of opiate analgesic: Secondary | ICD-10-CM | POA: Diagnosis not present

## 2022-07-24 DIAGNOSIS — G8929 Other chronic pain: Secondary | ICD-10-CM | POA: Diagnosis not present

## 2022-07-24 DIAGNOSIS — M5416 Radiculopathy, lumbar region: Secondary | ICD-10-CM | POA: Diagnosis not present

## 2022-08-21 DIAGNOSIS — M5416 Radiculopathy, lumbar region: Secondary | ICD-10-CM | POA: Diagnosis not present

## 2022-08-21 DIAGNOSIS — Z79899 Other long term (current) drug therapy: Secondary | ICD-10-CM | POA: Diagnosis not present

## 2022-08-21 DIAGNOSIS — M961 Postlaminectomy syndrome, not elsewhere classified: Secondary | ICD-10-CM | POA: Diagnosis not present

## 2022-08-21 DIAGNOSIS — G8929 Other chronic pain: Secondary | ICD-10-CM | POA: Diagnosis not present

## 2022-08-27 DIAGNOSIS — G4733 Obstructive sleep apnea (adult) (pediatric): Secondary | ICD-10-CM | POA: Diagnosis not present

## 2022-09-18 DIAGNOSIS — M5416 Radiculopathy, lumbar region: Secondary | ICD-10-CM | POA: Diagnosis not present

## 2022-09-18 DIAGNOSIS — M961 Postlaminectomy syndrome, not elsewhere classified: Secondary | ICD-10-CM | POA: Diagnosis not present

## 2022-09-18 DIAGNOSIS — Z79899 Other long term (current) drug therapy: Secondary | ICD-10-CM | POA: Diagnosis not present

## 2022-10-03 DIAGNOSIS — Z23 Encounter for immunization: Secondary | ICD-10-CM | POA: Diagnosis not present

## 2022-10-16 DIAGNOSIS — M5416 Radiculopathy, lumbar region: Secondary | ICD-10-CM | POA: Diagnosis not present

## 2022-10-16 DIAGNOSIS — Z79899 Other long term (current) drug therapy: Secondary | ICD-10-CM | POA: Diagnosis not present

## 2022-10-16 DIAGNOSIS — M961 Postlaminectomy syndrome, not elsewhere classified: Secondary | ICD-10-CM | POA: Diagnosis not present

## 2022-10-16 DIAGNOSIS — G8929 Other chronic pain: Secondary | ICD-10-CM | POA: Diagnosis not present

## 2022-10-21 DIAGNOSIS — Z79899 Other long term (current) drug therapy: Secondary | ICD-10-CM | POA: Diagnosis not present

## 2022-10-29 DIAGNOSIS — H26491 Other secondary cataract, right eye: Secondary | ICD-10-CM | POA: Diagnosis not present

## 2022-11-05 DIAGNOSIS — N200 Calculus of kidney: Secondary | ICD-10-CM | POA: Diagnosis not present

## 2022-11-06 DIAGNOSIS — N200 Calculus of kidney: Secondary | ICD-10-CM | POA: Diagnosis not present

## 2022-11-06 DIAGNOSIS — R310 Gross hematuria: Secondary | ICD-10-CM | POA: Diagnosis not present

## 2022-11-06 DIAGNOSIS — R110 Nausea: Secondary | ICD-10-CM | POA: Diagnosis not present

## 2022-11-06 DIAGNOSIS — N390 Urinary tract infection, site not specified: Secondary | ICD-10-CM | POA: Diagnosis not present

## 2022-11-07 DIAGNOSIS — H6121 Impacted cerumen, right ear: Secondary | ICD-10-CM | POA: Diagnosis not present

## 2022-11-13 DIAGNOSIS — Z79899 Other long term (current) drug therapy: Secondary | ICD-10-CM | POA: Diagnosis not present

## 2022-11-13 DIAGNOSIS — M961 Postlaminectomy syndrome, not elsewhere classified: Secondary | ICD-10-CM | POA: Diagnosis not present

## 2022-11-13 DIAGNOSIS — M5416 Radiculopathy, lumbar region: Secondary | ICD-10-CM | POA: Diagnosis not present

## 2022-11-13 DIAGNOSIS — G8929 Other chronic pain: Secondary | ICD-10-CM | POA: Diagnosis not present

## 2022-11-19 DIAGNOSIS — G4733 Obstructive sleep apnea (adult) (pediatric): Secondary | ICD-10-CM | POA: Diagnosis not present

## 2022-11-27 DIAGNOSIS — N390 Urinary tract infection, site not specified: Secondary | ICD-10-CM | POA: Diagnosis not present

## 2022-11-27 DIAGNOSIS — N200 Calculus of kidney: Secondary | ICD-10-CM | POA: Diagnosis not present

## 2022-11-27 DIAGNOSIS — R310 Gross hematuria: Secondary | ICD-10-CM | POA: Diagnosis not present

## 2022-11-28 DIAGNOSIS — N201 Calculus of ureter: Secondary | ICD-10-CM | POA: Diagnosis not present

## 2022-11-28 DIAGNOSIS — N261 Atrophy of kidney (terminal): Secondary | ICD-10-CM | POA: Diagnosis not present

## 2022-11-28 DIAGNOSIS — R310 Gross hematuria: Secondary | ICD-10-CM | POA: Diagnosis not present

## 2022-11-28 DIAGNOSIS — Z87442 Personal history of urinary calculi: Secondary | ICD-10-CM | POA: Diagnosis not present

## 2022-11-28 DIAGNOSIS — N202 Calculus of kidney with calculus of ureter: Secondary | ICD-10-CM | POA: Diagnosis not present

## 2022-12-04 DIAGNOSIS — J449 Chronic obstructive pulmonary disease, unspecified: Secondary | ICD-10-CM | POA: Diagnosis not present

## 2022-12-04 DIAGNOSIS — Z0000 Encounter for general adult medical examination without abnormal findings: Secondary | ICD-10-CM | POA: Diagnosis not present

## 2022-12-04 DIAGNOSIS — N1831 Chronic kidney disease, stage 3a: Secondary | ICD-10-CM | POA: Diagnosis not present

## 2022-12-04 DIAGNOSIS — E782 Mixed hyperlipidemia: Secondary | ICD-10-CM | POA: Diagnosis not present

## 2022-12-04 DIAGNOSIS — I10 Essential (primary) hypertension: Secondary | ICD-10-CM | POA: Diagnosis not present

## 2022-12-04 DIAGNOSIS — I259 Chronic ischemic heart disease, unspecified: Secondary | ICD-10-CM | POA: Diagnosis not present

## 2022-12-04 DIAGNOSIS — Z79899 Other long term (current) drug therapy: Secondary | ICD-10-CM | POA: Diagnosis not present

## 2022-12-04 DIAGNOSIS — N200 Calculus of kidney: Secondary | ICD-10-CM | POA: Diagnosis not present

## 2022-12-04 DIAGNOSIS — I700 Atherosclerosis of aorta: Secondary | ICD-10-CM | POA: Diagnosis not present

## 2022-12-04 DIAGNOSIS — M1990 Unspecified osteoarthritis, unspecified site: Secondary | ICD-10-CM | POA: Diagnosis not present

## 2022-12-04 DIAGNOSIS — R310 Gross hematuria: Secondary | ICD-10-CM | POA: Diagnosis not present

## 2022-12-04 DIAGNOSIS — I7121 Aneurysm of the ascending aorta, without rupture: Secondary | ICD-10-CM | POA: Diagnosis not present

## 2022-12-13 DIAGNOSIS — M961 Postlaminectomy syndrome, not elsewhere classified: Secondary | ICD-10-CM | POA: Diagnosis not present

## 2022-12-13 DIAGNOSIS — M5416 Radiculopathy, lumbar region: Secondary | ICD-10-CM | POA: Diagnosis not present

## 2022-12-13 DIAGNOSIS — Z79899 Other long term (current) drug therapy: Secondary | ICD-10-CM | POA: Diagnosis not present

## 2022-12-13 DIAGNOSIS — G8929 Other chronic pain: Secondary | ICD-10-CM | POA: Diagnosis not present

## 2022-12-16 DIAGNOSIS — J439 Emphysema, unspecified: Secondary | ICD-10-CM | POA: Diagnosis not present

## 2022-12-16 DIAGNOSIS — I2510 Atherosclerotic heart disease of native coronary artery without angina pectoris: Secondary | ICD-10-CM | POA: Diagnosis not present

## 2022-12-16 DIAGNOSIS — I7121 Aneurysm of the ascending aorta, without rupture: Secondary | ICD-10-CM | POA: Diagnosis not present

## 2022-12-16 DIAGNOSIS — I700 Atherosclerosis of aorta: Secondary | ICD-10-CM | POA: Diagnosis not present

## 2022-12-16 DIAGNOSIS — Z122 Encounter for screening for malignant neoplasm of respiratory organs: Secondary | ICD-10-CM | POA: Diagnosis not present

## 2022-12-16 DIAGNOSIS — N200 Calculus of kidney: Secondary | ICD-10-CM | POA: Diagnosis not present

## 2022-12-16 DIAGNOSIS — F17210 Nicotine dependence, cigarettes, uncomplicated: Secondary | ICD-10-CM | POA: Diagnosis not present

## 2022-12-25 DIAGNOSIS — R310 Gross hematuria: Secondary | ICD-10-CM | POA: Diagnosis not present

## 2022-12-25 DIAGNOSIS — N200 Calculus of kidney: Secondary | ICD-10-CM | POA: Diagnosis not present

## 2022-12-26 DIAGNOSIS — N200 Calculus of kidney: Secondary | ICD-10-CM | POA: Diagnosis not present

## 2023-01-03 DIAGNOSIS — I712 Thoracic aortic aneurysm, without rupture, unspecified: Secondary | ICD-10-CM | POA: Diagnosis not present

## 2023-01-03 DIAGNOSIS — Z720 Tobacco use: Secondary | ICD-10-CM | POA: Diagnosis not present

## 2023-01-03 DIAGNOSIS — I2510 Atherosclerotic heart disease of native coronary artery without angina pectoris: Secondary | ICD-10-CM | POA: Diagnosis not present

## 2023-01-03 DIAGNOSIS — I714 Abdominal aortic aneurysm, without rupture, unspecified: Secondary | ICD-10-CM | POA: Diagnosis not present

## 2023-01-03 DIAGNOSIS — I10 Essential (primary) hypertension: Secondary | ICD-10-CM | POA: Diagnosis not present

## 2023-01-03 DIAGNOSIS — I6523 Occlusion and stenosis of bilateral carotid arteries: Secondary | ICD-10-CM | POA: Diagnosis not present

## 2023-01-03 DIAGNOSIS — E785 Hyperlipidemia, unspecified: Secondary | ICD-10-CM | POA: Diagnosis not present

## 2023-01-09 DIAGNOSIS — I6523 Occlusion and stenosis of bilateral carotid arteries: Secondary | ICD-10-CM | POA: Diagnosis not present

## 2023-01-10 DIAGNOSIS — M961 Postlaminectomy syndrome, not elsewhere classified: Secondary | ICD-10-CM | POA: Diagnosis not present

## 2023-01-10 DIAGNOSIS — Z79899 Other long term (current) drug therapy: Secondary | ICD-10-CM | POA: Diagnosis not present

## 2023-01-10 DIAGNOSIS — G8929 Other chronic pain: Secondary | ICD-10-CM | POA: Diagnosis not present

## 2023-01-10 DIAGNOSIS — M5416 Radiculopathy, lumbar region: Secondary | ICD-10-CM | POA: Diagnosis not present

## 2023-03-21 DIAGNOSIS — I10 Essential (primary) hypertension: Secondary | ICD-10-CM | POA: Diagnosis not present

## 2023-03-21 DIAGNOSIS — N200 Calculus of kidney: Secondary | ICD-10-CM | POA: Diagnosis not present

## 2023-03-21 DIAGNOSIS — N202 Calculus of kidney with calculus of ureter: Secondary | ICD-10-CM | POA: Diagnosis not present

## 2023-03-21 DIAGNOSIS — Z79899 Other long term (current) drug therapy: Secondary | ICD-10-CM | POA: Diagnosis not present

## 2023-03-21 DIAGNOSIS — F17210 Nicotine dependence, cigarettes, uncomplicated: Secondary | ICD-10-CM | POA: Diagnosis not present

## 2023-03-21 DIAGNOSIS — M5416 Radiculopathy, lumbar region: Secondary | ICD-10-CM | POA: Diagnosis not present

## 2023-03-21 DIAGNOSIS — Z7951 Long term (current) use of inhaled steroids: Secondary | ICD-10-CM | POA: Diagnosis not present

## 2023-03-21 DIAGNOSIS — G8929 Other chronic pain: Secondary | ICD-10-CM | POA: Diagnosis not present

## 2023-03-21 DIAGNOSIS — M961 Postlaminectomy syndrome, not elsewhere classified: Secondary | ICD-10-CM | POA: Diagnosis not present

## 2023-03-25 DIAGNOSIS — Z79899 Other long term (current) drug therapy: Secondary | ICD-10-CM | POA: Diagnosis not present

## 2023-03-26 DIAGNOSIS — N200 Calculus of kidney: Secondary | ICD-10-CM | POA: Diagnosis not present

## 2023-03-27 DIAGNOSIS — I10 Essential (primary) hypertension: Secondary | ICD-10-CM | POA: Diagnosis not present

## 2023-03-27 DIAGNOSIS — E785 Hyperlipidemia, unspecified: Secondary | ICD-10-CM | POA: Diagnosis not present

## 2023-03-27 DIAGNOSIS — I259 Chronic ischemic heart disease, unspecified: Secondary | ICD-10-CM | POA: Diagnosis not present

## 2023-03-27 DIAGNOSIS — J449 Chronic obstructive pulmonary disease, unspecified: Secondary | ICD-10-CM | POA: Diagnosis not present

## 2023-04-18 DIAGNOSIS — G8929 Other chronic pain: Secondary | ICD-10-CM | POA: Diagnosis not present

## 2023-04-18 DIAGNOSIS — Z7951 Long term (current) use of inhaled steroids: Secondary | ICD-10-CM | POA: Diagnosis not present

## 2023-04-18 DIAGNOSIS — F17210 Nicotine dependence, cigarettes, uncomplicated: Secondary | ICD-10-CM | POA: Diagnosis not present

## 2023-04-18 DIAGNOSIS — Z79899 Other long term (current) drug therapy: Secondary | ICD-10-CM | POA: Diagnosis not present

## 2023-04-18 DIAGNOSIS — M5416 Radiculopathy, lumbar region: Secondary | ICD-10-CM | POA: Diagnosis not present

## 2023-04-18 DIAGNOSIS — I10 Essential (primary) hypertension: Secondary | ICD-10-CM | POA: Diagnosis not present

## 2023-04-18 DIAGNOSIS — M961 Postlaminectomy syndrome, not elsewhere classified: Secondary | ICD-10-CM | POA: Diagnosis not present

## 2023-05-13 DIAGNOSIS — H6121 Impacted cerumen, right ear: Secondary | ICD-10-CM | POA: Diagnosis not present

## 2023-05-14 DIAGNOSIS — N200 Calculus of kidney: Secondary | ICD-10-CM | POA: Diagnosis not present

## 2023-05-15 DIAGNOSIS — G4733 Obstructive sleep apnea (adult) (pediatric): Secondary | ICD-10-CM | POA: Diagnosis not present

## 2023-05-16 DIAGNOSIS — M961 Postlaminectomy syndrome, not elsewhere classified: Secondary | ICD-10-CM | POA: Diagnosis not present

## 2023-05-16 DIAGNOSIS — Z79899 Other long term (current) drug therapy: Secondary | ICD-10-CM | POA: Diagnosis not present

## 2023-05-16 DIAGNOSIS — G8929 Other chronic pain: Secondary | ICD-10-CM | POA: Diagnosis not present

## 2023-05-16 DIAGNOSIS — M5416 Radiculopathy, lumbar region: Secondary | ICD-10-CM | POA: Diagnosis not present

## 2023-06-16 DIAGNOSIS — M961 Postlaminectomy syndrome, not elsewhere classified: Secondary | ICD-10-CM | POA: Diagnosis not present

## 2023-06-16 DIAGNOSIS — M5416 Radiculopathy, lumbar region: Secondary | ICD-10-CM | POA: Diagnosis not present

## 2023-06-16 DIAGNOSIS — Z79899 Other long term (current) drug therapy: Secondary | ICD-10-CM | POA: Diagnosis not present

## 2023-06-16 DIAGNOSIS — G8929 Other chronic pain: Secondary | ICD-10-CM | POA: Diagnosis not present

## 2023-06-27 DIAGNOSIS — N183 Chronic kidney disease, stage 3 unspecified: Secondary | ICD-10-CM | POA: Diagnosis not present

## 2023-09-24 DIAGNOSIS — J449 Chronic obstructive pulmonary disease, unspecified: Secondary | ICD-10-CM | POA: Diagnosis not present

## 2023-09-24 DIAGNOSIS — I259 Chronic ischemic heart disease, unspecified: Secondary | ICD-10-CM | POA: Diagnosis not present

## 2023-09-24 DIAGNOSIS — L858 Other specified epidermal thickening: Secondary | ICD-10-CM | POA: Diagnosis not present

## 2023-09-24 DIAGNOSIS — E785 Hyperlipidemia, unspecified: Secondary | ICD-10-CM | POA: Diagnosis not present

## 2023-09-24 DIAGNOSIS — I10 Essential (primary) hypertension: Secondary | ICD-10-CM | POA: Diagnosis not present

## 2023-10-10 DIAGNOSIS — G8929 Other chronic pain: Secondary | ICD-10-CM | POA: Diagnosis not present

## 2023-10-10 DIAGNOSIS — I10 Essential (primary) hypertension: Secondary | ICD-10-CM | POA: Diagnosis not present

## 2023-10-10 DIAGNOSIS — Z79899 Other long term (current) drug therapy: Secondary | ICD-10-CM | POA: Diagnosis not present

## 2023-10-10 DIAGNOSIS — M961 Postlaminectomy syndrome, not elsewhere classified: Secondary | ICD-10-CM | POA: Diagnosis not present

## 2023-10-10 DIAGNOSIS — M5416 Radiculopathy, lumbar region: Secondary | ICD-10-CM | POA: Diagnosis not present

## 2023-10-29 DIAGNOSIS — H35372 Puckering of macula, left eye: Secondary | ICD-10-CM | POA: Diagnosis not present

## 2023-11-10 DIAGNOSIS — G8929 Other chronic pain: Secondary | ICD-10-CM | POA: Diagnosis not present

## 2023-11-10 DIAGNOSIS — M961 Postlaminectomy syndrome, not elsewhere classified: Secondary | ICD-10-CM | POA: Diagnosis not present

## 2023-11-10 DIAGNOSIS — M5416 Radiculopathy, lumbar region: Secondary | ICD-10-CM | POA: Diagnosis not present

## 2023-11-10 DIAGNOSIS — Z79899 Other long term (current) drug therapy: Secondary | ICD-10-CM | POA: Diagnosis not present

## 2023-11-28 DIAGNOSIS — G4733 Obstructive sleep apnea (adult) (pediatric): Secondary | ICD-10-CM | POA: Diagnosis not present

## 2023-12-10 DIAGNOSIS — G8929 Other chronic pain: Secondary | ICD-10-CM | POA: Diagnosis not present

## 2023-12-10 DIAGNOSIS — M961 Postlaminectomy syndrome, not elsewhere classified: Secondary | ICD-10-CM | POA: Diagnosis not present

## 2023-12-10 DIAGNOSIS — H6123 Impacted cerumen, bilateral: Secondary | ICD-10-CM | POA: Diagnosis not present

## 2023-12-10 DIAGNOSIS — Z79899 Other long term (current) drug therapy: Secondary | ICD-10-CM | POA: Diagnosis not present

## 2023-12-10 DIAGNOSIS — M5416 Radiculopathy, lumbar region: Secondary | ICD-10-CM | POA: Diagnosis not present

## 2023-12-18 DIAGNOSIS — E785 Hyperlipidemia, unspecified: Secondary | ICD-10-CM | POA: Diagnosis not present

## 2023-12-18 DIAGNOSIS — I6523 Occlusion and stenosis of bilateral carotid arteries: Secondary | ICD-10-CM | POA: Diagnosis not present

## 2023-12-18 DIAGNOSIS — Z8679 Personal history of other diseases of the circulatory system: Secondary | ICD-10-CM | POA: Diagnosis not present

## 2023-12-18 DIAGNOSIS — Z720 Tobacco use: Secondary | ICD-10-CM | POA: Diagnosis not present

## 2023-12-18 DIAGNOSIS — R202 Paresthesia of skin: Secondary | ICD-10-CM | POA: Diagnosis not present

## 2023-12-18 DIAGNOSIS — R200 Anesthesia of skin: Secondary | ICD-10-CM | POA: Diagnosis not present

## 2023-12-18 DIAGNOSIS — I712 Thoracic aortic aneurysm, without rupture, unspecified: Secondary | ICD-10-CM | POA: Diagnosis not present

## 2023-12-18 DIAGNOSIS — I10 Essential (primary) hypertension: Secondary | ICD-10-CM | POA: Diagnosis not present

## 2024-01-09 DIAGNOSIS — Z79899 Other long term (current) drug therapy: Secondary | ICD-10-CM | POA: Diagnosis not present

## 2024-01-09 DIAGNOSIS — I10 Essential (primary) hypertension: Secondary | ICD-10-CM | POA: Diagnosis not present

## 2024-01-09 DIAGNOSIS — M5416 Radiculopathy, lumbar region: Secondary | ICD-10-CM | POA: Diagnosis not present

## 2024-01-09 DIAGNOSIS — M129 Arthropathy, unspecified: Secondary | ICD-10-CM | POA: Diagnosis not present

## 2024-01-09 DIAGNOSIS — M961 Postlaminectomy syndrome, not elsewhere classified: Secondary | ICD-10-CM | POA: Diagnosis not present

## 2024-01-09 DIAGNOSIS — G8929 Other chronic pain: Secondary | ICD-10-CM | POA: Diagnosis not present

## 2024-01-14 DIAGNOSIS — F17210 Nicotine dependence, cigarettes, uncomplicated: Secondary | ICD-10-CM | POA: Diagnosis not present

## 2024-01-14 DIAGNOSIS — M5416 Radiculopathy, lumbar region: Secondary | ICD-10-CM | POA: Diagnosis not present

## 2024-01-14 DIAGNOSIS — I10 Essential (primary) hypertension: Secondary | ICD-10-CM | POA: Diagnosis not present

## 2024-01-14 DIAGNOSIS — M961 Postlaminectomy syndrome, not elsewhere classified: Secondary | ICD-10-CM | POA: Diagnosis not present

## 2024-01-14 DIAGNOSIS — Z6824 Body mass index (BMI) 24.0-24.9, adult: Secondary | ICD-10-CM | POA: Diagnosis not present

## 2024-01-14 DIAGNOSIS — Z87891 Personal history of nicotine dependence: Secondary | ICD-10-CM | POA: Diagnosis not present

## 2024-01-14 DIAGNOSIS — G8929 Other chronic pain: Secondary | ICD-10-CM | POA: Diagnosis not present

## 2024-01-14 DIAGNOSIS — Z79899 Other long term (current) drug therapy: Secondary | ICD-10-CM | POA: Diagnosis not present

## 2024-01-14 DIAGNOSIS — N1832 Chronic kidney disease, stage 3b: Secondary | ICD-10-CM | POA: Diagnosis not present

## 2024-02-06 DIAGNOSIS — Z6823 Body mass index (BMI) 23.0-23.9, adult: Secondary | ICD-10-CM | POA: Diagnosis not present

## 2024-02-06 DIAGNOSIS — Z79899 Other long term (current) drug therapy: Secondary | ICD-10-CM | POA: Diagnosis not present

## 2024-02-06 DIAGNOSIS — M5416 Radiculopathy, lumbar region: Secondary | ICD-10-CM | POA: Diagnosis not present

## 2024-02-06 DIAGNOSIS — M961 Postlaminectomy syndrome, not elsewhere classified: Secondary | ICD-10-CM | POA: Diagnosis not present

## 2024-02-06 DIAGNOSIS — G8929 Other chronic pain: Secondary | ICD-10-CM | POA: Diagnosis not present

## 2024-02-25 DIAGNOSIS — H6121 Impacted cerumen, right ear: Secondary | ICD-10-CM | POA: Diagnosis not present

## 2024-03-08 DIAGNOSIS — M961 Postlaminectomy syndrome, not elsewhere classified: Secondary | ICD-10-CM | POA: Diagnosis not present

## 2024-03-08 DIAGNOSIS — Z79899 Other long term (current) drug therapy: Secondary | ICD-10-CM | POA: Diagnosis not present

## 2024-03-08 DIAGNOSIS — Z6823 Body mass index (BMI) 23.0-23.9, adult: Secondary | ICD-10-CM | POA: Diagnosis not present

## 2024-03-12 DIAGNOSIS — N200 Calculus of kidney: Secondary | ICD-10-CM | POA: Diagnosis not present

## 2024-03-16 DIAGNOSIS — N200 Calculus of kidney: Secondary | ICD-10-CM | POA: Diagnosis not present

## 2024-04-06 DIAGNOSIS — I259 Chronic ischemic heart disease, unspecified: Secondary | ICD-10-CM | POA: Diagnosis not present

## 2024-04-06 DIAGNOSIS — E785 Hyperlipidemia, unspecified: Secondary | ICD-10-CM | POA: Diagnosis not present

## 2024-04-06 DIAGNOSIS — Z23 Encounter for immunization: Secondary | ICD-10-CM | POA: Diagnosis not present

## 2024-04-06 DIAGNOSIS — L989 Disorder of the skin and subcutaneous tissue, unspecified: Secondary | ICD-10-CM | POA: Diagnosis not present

## 2024-04-06 DIAGNOSIS — N1831 Chronic kidney disease, stage 3a: Secondary | ICD-10-CM | POA: Diagnosis not present

## 2024-04-06 DIAGNOSIS — Z6823 Body mass index (BMI) 23.0-23.9, adult: Secondary | ICD-10-CM | POA: Diagnosis not present

## 2024-04-06 DIAGNOSIS — M961 Postlaminectomy syndrome, not elsewhere classified: Secondary | ICD-10-CM | POA: Diagnosis not present

## 2024-04-06 DIAGNOSIS — Z79899 Other long term (current) drug therapy: Secondary | ICD-10-CM | POA: Diagnosis not present

## 2024-04-06 DIAGNOSIS — J449 Chronic obstructive pulmonary disease, unspecified: Secondary | ICD-10-CM | POA: Diagnosis not present

## 2024-04-06 DIAGNOSIS — I10 Essential (primary) hypertension: Secondary | ICD-10-CM | POA: Diagnosis not present

## 2024-04-30 DIAGNOSIS — I714 Abdominal aortic aneurysm, without rupture, unspecified: Secondary | ICD-10-CM | POA: Diagnosis not present

## 2024-04-30 DIAGNOSIS — I2510 Atherosclerotic heart disease of native coronary artery without angina pectoris: Secondary | ICD-10-CM | POA: Diagnosis not present

## 2024-04-30 DIAGNOSIS — Z720 Tobacco use: Secondary | ICD-10-CM | POA: Diagnosis not present

## 2024-04-30 DIAGNOSIS — I10 Essential (primary) hypertension: Secondary | ICD-10-CM | POA: Diagnosis not present

## 2024-04-30 DIAGNOSIS — E785 Hyperlipidemia, unspecified: Secondary | ICD-10-CM | POA: Diagnosis not present

## 2024-04-30 DIAGNOSIS — I6523 Occlusion and stenosis of bilateral carotid arteries: Secondary | ICD-10-CM | POA: Diagnosis not present

## 2024-04-30 DIAGNOSIS — I712 Thoracic aortic aneurysm, without rupture, unspecified: Secondary | ICD-10-CM | POA: Diagnosis not present

## 2024-05-06 DIAGNOSIS — L853 Xerosis cutis: Secondary | ICD-10-CM | POA: Diagnosis not present

## 2024-05-06 DIAGNOSIS — Z79899 Other long term (current) drug therapy: Secondary | ICD-10-CM | POA: Diagnosis not present

## 2024-05-06 DIAGNOSIS — Z87891 Personal history of nicotine dependence: Secondary | ICD-10-CM | POA: Diagnosis not present

## 2024-05-06 DIAGNOSIS — I10 Essential (primary) hypertension: Secondary | ICD-10-CM | POA: Diagnosis not present

## 2024-05-06 DIAGNOSIS — D485 Neoplasm of uncertain behavior of skin: Secondary | ICD-10-CM | POA: Diagnosis not present

## 2024-05-06 DIAGNOSIS — M5416 Radiculopathy, lumbar region: Secondary | ICD-10-CM | POA: Diagnosis not present

## 2024-05-06 DIAGNOSIS — Z6824 Body mass index (BMI) 24.0-24.9, adult: Secondary | ICD-10-CM | POA: Diagnosis not present

## 2024-05-06 DIAGNOSIS — G8929 Other chronic pain: Secondary | ICD-10-CM | POA: Diagnosis not present

## 2024-05-06 DIAGNOSIS — F17210 Nicotine dependence, cigarettes, uncomplicated: Secondary | ICD-10-CM | POA: Diagnosis not present

## 2024-05-06 DIAGNOSIS — N1832 Chronic kidney disease, stage 3b: Secondary | ICD-10-CM | POA: Diagnosis not present

## 2024-05-06 DIAGNOSIS — M961 Postlaminectomy syndrome, not elsewhere classified: Secondary | ICD-10-CM | POA: Diagnosis not present
